# Patient Record
Sex: Female | Born: 1995 | Race: Black or African American | Hispanic: No | Smoking: Former smoker
Health system: Southern US, Community
[De-identification: ages and names within clinical notes are randomized; demographics above are authoritative.]

## PROBLEM LIST (undated history)

## (undated) DIAGNOSIS — K641 Second degree hemorrhoids: Secondary | ICD-10-CM

## (undated) HISTORY — DX: Second degree hemorrhoids: K64.1

## (undated) HISTORY — PX: OTHER SURGICAL HISTORY: SHX169

---

## 2017-11-05 ENCOUNTER — Other Ambulatory Visit: Payer: Self-pay

## 2017-11-05 ENCOUNTER — Emergency Department (HOSPITAL_COMMUNITY)
Admission: EM | Admit: 2017-11-05 | Discharge: 2017-11-05 | Disposition: A | Discharge status: Home / Self Care | Payer: Self-pay | Attending: Emergency Medicine | Admitting: Emergency Medicine

## 2017-11-05 ENCOUNTER — Emergency Department (HOSPITAL_COMMUNITY): Payer: Self-pay

## 2017-11-05 ENCOUNTER — Encounter (HOSPITAL_COMMUNITY): Payer: Self-pay | Admitting: Emergency Medicine

## 2017-11-05 DIAGNOSIS — R0789 Other chest pain: Secondary | ICD-10-CM | POA: Insufficient documentation

## 2017-11-05 DIAGNOSIS — F17210 Nicotine dependence, cigarettes, uncomplicated: Secondary | ICD-10-CM | POA: Insufficient documentation

## 2017-11-05 LAB — BASIC METABOLIC PANEL
ANION GAP: 7 (ref 5–15)
BUN: 8 mg/dL (ref 6–20)
CHLORIDE: 105 mmol/L (ref 98–111)
CO2: 26 mmol/L (ref 22–32)
Calcium: 9.5 mg/dL (ref 8.9–10.3)
Creatinine, Ser: 1.02 mg/dL — ABNORMAL HIGH (ref 0.44–1.00)
GFR calc Af Amer: >60 mL/min (ref >60)
GLUCOSE: 97 mg/dL (ref 70–99)
Potassium: 4.3 mmol/L (ref 3.5–5.1)
Sodium: 138 mmol/L (ref 135–145)

## 2017-11-05 LAB — I-STAT TROPONIN, ED: Troponin i, poc: 0.02 ng/mL (ref 0.00–0.08)

## 2017-11-05 LAB — CBC
HEMATOCRIT: 42.4 % (ref 36.0–46.0)
HEMOGLOBIN: 12.9 g/dL (ref 12.0–15.0)
MCH: 26.1 pg (ref 26.0–34.0)
MCHC: 30.4 g/dL (ref 30.0–36.0)
MCV: 85.8 fL (ref 78.0–100.0)
Platelets: 204 10*3/uL (ref 150–400)
RBC: 4.94 MIL/uL (ref 3.87–5.11)
RDW: 12.7 % (ref 11.5–15.5)
WBC: 5.5 10*3/uL (ref 4.0–10.5)

## 2017-11-05 LAB — D-DIMER, QUANTITATIVE (NOT AT ARMC)

## 2017-11-05 LAB — I-STAT BETA HCG BLOOD, ED (MC, WL, AP ONLY): I-stat hCG, quantitative: <5 m[IU]/mL (ref <5)

## 2017-11-05 MED ORDER — NAPROXEN 500 MG PO TABS: 500.0000 mg | ORAL_TABLET | Freq: Two times a day (BID) | ORAL | 0 refills | Status: DC

## 2017-11-05 NOTE — ED Triage Notes (Signed)
Patient to ED c/o L sided CP x 1 month, worse over the last couple of days, intermittent and sharp in nature. Denies worse with movement, non-tender, no SOB or lightheadedness. Pt endorses increase in stress recently, LMP 3 months ago.

## 2017-11-05 NOTE — ED Provider Notes (Signed)
[facility]   CSN: [id number] Arrival date & time: 11/05/17  0830     History   Chief Complaint Chief Complaint  Patient presents with  . Chest Pain    HPI Elaine PhenixDontasha Rodriguez is a 22 y.o. female.  Patient is a 22 year old female with no significant past medical history who presents with chest pain.  She describes a 6416-month history of some intermittent pain in her left upper chest.  She states that usually lasts for about an hour.  She says it slightly worse with breathing.  She denies any shortness of breath.  Is not really worse with movement.  She has no nausea or vomiting.  No cough or chest congestion.  No prior history of heart problems.  She has not been evaluated for this pain in the past.  She is a non-smoker.  She denies any leg pain or swelling.  She denies any recent immobilization other than she did just recently traveled from [place].  No history of DVTs.  She is not on oral contraceptives.     History reviewed. No pertinent past medical history.  There are no active problems to display for this patient.   History reviewed. No pertinent surgical history.   OB History   None      Home Medications    Prior to Admission medications   Medication Sig Start Date End Date Taking? Authorizing Provider  naproxen (NAPROSYN) 500 MG tablet Take 1 tablet (500 mg total) by mouth 2 (two) times daily. 11/05/17   Rolan BuccoBelfi, Maaliyah Adolph, MD    Family History No family history on file.  Social History Social History   Tobacco Use  . Smoking status: Current Some Day Smoker  . Smokeless tobacco: Never Used  Substance Use Topics  . Alcohol use: Yes    Comment: occ  . Drug use: Never     Allergies   Patient has no known allergies.   Review of Systems Review of Systems  Constitutional: Negative for chills, diaphoresis, fatigue and fever.  HENT: Negative for congestion, rhinorrhea and sneezing.   Eyes: Negative.     Respiratory: Negative for cough, chest tightness and shortness of breath.   Cardiovascular: Positive for chest pain. Negative for leg swelling.  Gastrointestinal: Negative for abdominal pain, blood in stool, diarrhea, nausea and vomiting.  Genitourinary: Negative for difficulty urinating, flank pain, frequency and hematuria.  Musculoskeletal: Negative for arthralgias and back pain.  Skin: Negative for rash.  Neurological: Negative for dizziness, speech difficulty, weakness, numbness and headaches.     Physical Exam Updated Vital Signs BP 107/70   Pulse 67   Temp 98.7 F (37.1 C) (Oral)   Resp 17   LMP 08/07/2017 (Approximate) Comment: reports hasn't had one in 3 months, thinks d/t stress  SpO2 99%   Physical Exam  Constitutional: She is oriented to person, place, and time. She appears well-developed and well-nourished.  HENT:  Head: Normocephalic and atraumatic.  Eyes: Pupils are equal, round, and reactive to light.  Neck: Normal range of motion. Neck supple.  Cardiovascular: Normal rate, regular rhythm and normal heart sounds.  Patient has some tenderness to her left upper chest wall, no crepitus or deformity, no overlying rash  Pulmonary/Chest: Effort normal and breath sounds normal. No respiratory distress. She has no wheezes. She has no rales. She exhibits no tenderness.  Abdominal: Soft. Bowel sounds are normal. There is no tenderness. There is no rebound and no guarding.  Musculoskeletal: Normal  range of motion. She exhibits no edema.  No edema or calf tenderness  Lymphadenopathy:    She has no cervical adenopathy.  Neurological: She is alert and oriented to person, place, and time.  Skin: Skin is warm and dry. No rash noted.  Psychiatric: She has a normal mood and affect.     ED Treatments / Results  Labs (all labs ordered are listed, but only abnormal results are displayed) Labs Reviewed  BASIC METABOLIC PANEL - Abnormal; Notable for the following components:       Result Value   Creatinine, Ser 1.02 (*)    All other components within normal limits  CBC  D-DIMER, QUANTITATIVE (NOT AT [facility])  I-STAT TROPONIN, ED  I-STAT BETA HCG BLOOD, ED ([facility], [facility], [facility] ONLY)    EKG EKG Interpretation  Date/Time:  Tuesday November 05 2017 08:40:16 EDT Ventricular Rate:  83 PR Interval:  138 QRS Duration: 80 QT Interval:  356 QTC Calculation: 418 R Axis:   70 Text Interpretation:  Normal sinus rhythm with sinus arrhythmia no acute ST/T changes No old tracing to compare Confirmed by Pricilla LovelessGoldston, Scott [phone]([id number]) on 11/05/2017 8:58:45 AM   Radiology Dg Chest 2 View  Result Date: 11/05/2017 CLINICAL DATA:  Midline to left-sided chest pain radiating into the left shoulder with numbness in the left arm and hand. Current smoker. EXAM: CHEST - 2 VIEW COMPARISON:  None in PACs FINDINGS: The lungs are well-expanded and clear. The heart and mediastinal structures are normal. There is no pleural effusion or pneumothorax. The bony thorax exhibits no acute abnormality. IMPRESSION: There is no active cardiopulmonary disease. Electronically Signed   By: David  Swazil[place]Jordan M.D.   On: 11/05/2017 09:17    Procedures Procedures (including critical care time)  Medications Ordered in ED Medications - No data to display   Initial Impression / Assessment and Plan / ED Course  I have reviewed the triage vital signs and the nursing notes.  Pertinent labs & imaging results that were available during my care of the patient were reviewed by me and considered in my medical decision making (see chart for details).     Patient presents with left upper chest pain which is atypical in nature.  It is reproducible on palpation.  Her d-dimer is negative without other suggestions of PE.  She has no ischemic changes on EKG.  Her troponin is negative.  I feel this is likely musculoskeletal in nature.  Her chest x-ray is clear without evidence of pneumonia or pneumothorax.  She was given a prescription for  Naprosyn to use for pain.Marland Kitchen.  She was encouraged to follow-up with her PCP in [place] when she returns there next week.  Return precautions were given.  Final Clinical Impressions(s) / ED Diagnoses   Final diagnoses:  Atypical chest pain    ED Discharge Orders         Ordered    naproxen (NAPROSYN) 500 MG tablet  2 times daily     11/05/17 1333           Rolan BuccoBelfi, Raider Valbuena, MD 11/05/17 1346

## 2018-05-29 ENCOUNTER — Emergency Department (HOSPITAL_COMMUNITY)
Admission: EM | Admit: 2018-05-29 | Discharge: 2018-05-29 | Disposition: A | Discharge status: Home / Self Care | Payer: Medicaid Other | Attending: Emergency Medicine | Admitting: Emergency Medicine

## 2018-05-29 ENCOUNTER — Encounter (HOSPITAL_COMMUNITY): Payer: Self-pay

## 2018-05-29 ENCOUNTER — Other Ambulatory Visit: Payer: Self-pay

## 2018-05-29 DIAGNOSIS — F17200 Nicotine dependence, unspecified, uncomplicated: Secondary | ICD-10-CM | POA: Insufficient documentation

## 2018-05-29 DIAGNOSIS — H109 Unspecified conjunctivitis: Secondary | ICD-10-CM

## 2018-05-29 DIAGNOSIS — H1032 Unspecified acute conjunctivitis, left eye: Secondary | ICD-10-CM | POA: Insufficient documentation

## 2018-05-29 MED ORDER — BACITRACIN-POLYMYXIN B 500-10000 UNIT/GM OP OINT: 1.0000 "application " | TOPICAL_OINTMENT | Freq: Two times a day (BID) | OPHTHALMIC | 0 refills | Status: AC

## 2018-05-29 NOTE — ED Triage Notes (Signed)
Pt states she works at [employer] and thinks she contracted pink eye from a resident. L eye is swollen, red and itchy.

## 2018-05-29 NOTE — Discharge Instructions (Addendum)
You were evaluated in the Emergency Department and after careful evaluation, we did not find any emergent condition requiring admission or further testing in the hospital.  Your symptoms today seem to be due to pink eye.  Please use the ointment as directed.  Please return to the Emergency Department if you experience any worsening of your condition.  We encourage you to follow up with a primary care provider.  Thank you for allowing [place] to be a part of your care.

## 2018-05-29 NOTE — ED Provider Notes (Signed)
[facility] Emergency Department Provider Note MRN:  [id number]  Arrival date & time: 05/29/18     Chief Complaint   Conjunctivitis   History of Present Illness   Elaine Rodriguez is a 23 y.o. year-old female with no pertinent past medical history presenting to the ED with chief complaint of conjunctivitis.  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  Patient started having left eye irritation and redness 2 days ago, persistent, constant.  Denies trauma to the eye.  Denies fever, no vision loss, no other symptoms.  Review of Systems  A problem-focused ROS was performed. Positive for eye irritation.  Patient denies vision loss.  Patient's Health History   History reviewed. No pertinent past medical history.  History reviewed. No pertinent surgical history.  History reviewed. No pertinent family history.  Social History   Socioeconomic History  . Marital status: Single    Spouse name: Not on file  . Number of children: Not on file  . Years of education: Not on file  . Highest education level: Not on file  Occupational History  . Not on file  Social Needs  . Financial resource strain: Not on file  . Food insecurity:    Worry: Not on file    Inability: Not on file  . Transportation needs:    Medical: Not on file    Non-medical: Not on file  Tobacco Use  . Smoking status: Current Some Day Smoker  . Smokeless tobacco: Never Used  Substance and Sexual Activity  . Alcohol use: Yes    Comment: occ  . Drug use: Never  . Sexual activity: Not on file  Lifestyle  . Physical activity:    Days per week: Not on file    Minutes per session: Not on file  . Stress: Not on file  Relationships  . Social connections:    Talks on phone: Not on file    Gets together: Not on file    Attends religious service: Not on file    Active member of club or organization: Not on file    Attends meetings of clubs or  organizations: Not on file    Relationship status: Not on file  . Intimate partner violence:    Fear of current or ex partner: Not on file    Emotionally abused: Not on file    Physically abused: Not on file    Forced sexual activity: Not on file  Other Topics Concern  . Not on file  Social History Narrative  . Not on file     Physical Exam  Vital Signs and Nursing Notes reviewed Vitals:   05/29/18 1104  BP: (!) 141/81  Pulse: 61  Resp: 16  Temp: 98.4 F (36.9 C)  SpO2: 98%    CONSTITUTIONAL: Well-appearing, NAD NEURO:  Alert and oriented x 3, no focal deficits EYES:  eyes equal and reactive, normal extraocular movements, left conjunctival erythema, equal visual acuity ENT/NECK:  no LAD, no JVD CARDIO: Regular rate, well-perfused, normal S1 and S2 PULM:  CTAB no wheezing or rhonchi GI/GU:  normal bowel sounds, non-distended, non-tender MSK/SPINE:  No gross deformities, no edema SKIN:  no rash, atraumatic PSYCH:  Appropriate speech and behavior  Diagnostic and Interventional Summary    Labs Reviewed - No data to display  No orders to display    Medications - No data to display   Procedures Critical Care  ED Course and  Medical Decision Making  I have reviewed the triage vital signs and the nursing notes.  Pertinent labs & imaging results that were available during my care of the patient were reviewed by me and considered in my medical decision making (see below for details).  Consistent with uncomplicated conjunctivitis, viral versus bacterial.  Patient does not wear contacts, no trauma, no vision loss.  Will provide polymyxin ointment.  After the discussed management above, the patient was determined to be safe for discharge.  The patient was in agreement with this plan and all questions regarding their care were answered.  ED return precautions were discussed and the patient will return to the ED with any significant worsening of condition.  Elmer Sow. Pilar Plate, MD  [facility] Emergency Medicine [facility] [email or web address]  Final Clinical Impressions(s) / ED Diagnoses     ICD-10-CM   1. Conjunctivitis of left eye, unspecified conjunctivitis type H10.9     ED Discharge Orders         Ordered    bacitracin-polymyxin b (POLYSPORIN) ophthalmic ointment  Every 12 hours     05/29/18 1222             Sabas Sous, MD 05/29/18 1226

## 2018-10-27 ENCOUNTER — Emergency Department (HOSPITAL_COMMUNITY): Payer: Self-pay

## 2018-10-27 ENCOUNTER — Encounter (HOSPITAL_COMMUNITY): Payer: Self-pay | Admitting: Emergency Medicine

## 2018-10-27 ENCOUNTER — Emergency Department (HOSPITAL_COMMUNITY)
Admission: EM | Admit: 2018-10-27 | Discharge: 2018-10-27 | Disposition: A | Discharge status: Home / Self Care | Payer: Self-pay | Attending: Pediatric Emergency Medicine | Admitting: Pediatric Emergency Medicine

## 2018-10-27 ENCOUNTER — Other Ambulatory Visit: Payer: Self-pay

## 2018-10-27 DIAGNOSIS — W25XXXA Contact with sharp glass, initial encounter: Secondary | ICD-10-CM | POA: Insufficient documentation

## 2018-10-27 DIAGNOSIS — Y999 Unspecified external cause status: Secondary | ICD-10-CM | POA: Insufficient documentation

## 2018-10-27 DIAGNOSIS — F17200 Nicotine dependence, unspecified, uncomplicated: Secondary | ICD-10-CM | POA: Insufficient documentation

## 2018-10-27 DIAGNOSIS — S71112A Laceration without foreign body, left thigh, initial encounter: Secondary | ICD-10-CM | POA: Insufficient documentation

## 2018-10-27 DIAGNOSIS — Y939 Activity, unspecified: Secondary | ICD-10-CM | POA: Insufficient documentation

## 2018-10-27 DIAGNOSIS — Y929 Unspecified place or not applicable: Secondary | ICD-10-CM | POA: Insufficient documentation

## 2018-10-27 DIAGNOSIS — S81812A Laceration without foreign body, left lower leg, initial encounter: Secondary | ICD-10-CM

## 2018-10-27 DIAGNOSIS — Z23 Encounter for immunization: Secondary | ICD-10-CM | POA: Insufficient documentation

## 2018-10-27 LAB — POC URINE PREG, ED: Preg Test, Ur: NEGATIVE

## 2018-10-27 MED ORDER — LIDOCAINE-EPINEPHRINE (PF) 1 %-1:200000 IJ SOLN
10.0000 mL | Freq: Once | INTRAMUSCULAR | Status: AC
  Administered 2018-10-27: 10 mL via INTRADERMAL
  Filled 2018-10-27: qty 30

## 2018-10-27 MED ORDER — TETANUS-DIPHTH-ACELL PERTUSSIS 5-2.5-18.5 LF-MCG/0.5 IM SUSP
0.5000 mL | Freq: Once | INTRAMUSCULAR | Status: AC
  Administered 2018-10-27: 0.5 mL via INTRAMUSCULAR
  Filled 2018-10-27: qty 0.5

## 2018-10-27 MED ORDER — BACITRACIN ZINC 500 UNIT/GM EX OINT
TOPICAL_OINTMENT | Freq: Two times a day (BID) | CUTANEOUS | Status: DC
  Administered 2018-10-27: 05:00:00 via TOPICAL
  Filled 2018-10-27: qty 0.9

## 2018-10-27 NOTE — ED Notes (Signed)
Wound cleaned, bacitracin applied and wrapped

## 2018-10-27 NOTE — ED Notes (Signed)
ED Provider at bedside. 

## 2018-10-27 NOTE — ED Triage Notes (Signed)
Pt having like a 3 inches long laceration on her left thigh. Wound cleaned and blood controled.

## 2018-10-27 NOTE — Discharge Instructions (Addendum)
Please return for suture removal in 10 to 14 days.  Please return for any streaking redness coming from the wound pus drainage fever or unable to walk.

## 2018-10-27 NOTE — ED Notes (Signed)
ED Provider at bedside for lac repair 

## 2018-10-27 NOTE — ED Provider Notes (Signed)
[facility] Provider Note   CSN: [id number] Arrival date & time: 10/27/18  0024    History   Chief Complaint Chief Complaint  Patient presents with  . Laceration    HPI Elaine Rodriguez is a 23 y.o. female.     HPI   23 year old female otherwise healthy was cleaning a bearded dragon cage 1 glass shattered and cut her left thigh.  Pressure applied at home and bleeding stopped now presents.  No fevers cough or other sick symptoms.  History reviewed. No pertinent past medical history.  There are no active problems to display for this patient.   History reviewed. No pertinent surgical history.   OB History   No obstetric history on file.      Home Medications    Prior to Admission medications   Medication Sig Start Date End Date Taking? Authorizing Provider  naproxen (NAPROSYN) 500 MG tablet Take 1 tablet (500 mg total) by mouth 2 (two) times daily. 11/05/17   Rolan BuccoBelfi, Melanie, MD    Family History No family history on file.  Social History Social History   Tobacco Use  . Smoking status: Current Some Day Smoker  . Smokeless tobacco: Never Used  Substance Use Topics  . Alcohol use: Yes    Comment: occ  . Drug use: Never     Allergies   Patient has no known allergies.   Review of Systems Review of Systems  Constitutional: Positive for activity change.  Skin: Positive for wound.  All other systems reviewed and are negative.    Physical Exam Updated Vital Signs BP 116/77 (BP Location: Right Arm)   Pulse 72   Temp 98.2 F (36.8 C) (Oral)   Resp 16   Ht 5\' 11"  (1.803 m)   Wt 92.1 kg   SpO2 99%   BMI 28.31 kg/m   Physical Exam Vitals signs and nursing note reviewed.  Constitutional:      General: She is not in acute distress.    Appearance: She is well-developed.  HENT:     Head: Normocephalic and atraumatic.  Eyes:     Conjunctiva/sclera: Conjunctivae normal.  Neck:     Musculoskeletal: Neck  supple.  Cardiovascular:     Rate and Rhythm: Normal rate and regular rhythm.     Heart sounds: No murmur.  Pulmonary:     Effort: Pulmonary effort is normal. No respiratory distress.     Breath sounds: Normal breath sounds.  Abdominal:     Palpations: Abdomen is soft.     Tenderness: There is no abdominal tenderness.  Musculoskeletal:        General: Signs of injury (7-1/2 cm horseshoe laceration to left lateral thigh deep and gaping oozing) present.  Skin:    General: Skin is warm and dry.     Capillary Refill: Capillary refill takes less than 2 seconds.  Neurological:     General: No focal deficit present.     Mental Status: She is alert.      ED Treatments / Results  Labs (all labs ordered are listed, but only abnormal results are displayed) Labs Reviewed  POC URINE PREG, ED    EKG None  Radiology Dg Femur Min 2 Views Left  Result Date: 10/27/2018 CLINICAL DATA:  23 year old female with laceration of the cyst left thigh. EXAM: LEFT FEMUR 2 VIEWS COMPARISON:  None. FINDINGS: There is no acute fracture or dislocation. The bones are well mineralized. No arthritic changes. Laceration of the lateral  aspect of the left thigh. No radiopaque foreign object. IMPRESSION: 1. No acute/traumatic osseous pathology. 2. No radiopaque foreign body. Electronically Signed   By: Anner Crete M.D.   On: 10/27/2018 02:15    Procedures .Marland KitchenLaceration Repair  Date/Time: 10/27/2018 4:36 AM Performed by: Brent Bulla, MD Authorized by: Brent Bulla, MD   Consent:    Consent obtained:  Verbal   Consent given by:  Patient   Risks discussed:  Infection, pain and poor wound healing   Alternatives discussed:  No treatment Anesthesia (see MAR for exact dosages):    Anesthesia method:  Local infiltration   Local anesthetic:  Lidocaine 1% WITH epi Laceration details:    Location:  Leg   Leg location:  L upper leg   Length (cm):  7.5   Depth (mm):  7 Repair type:    Repair type:   Intermediate Pre-procedure details:    Preparation:  Patient was prepped and draped in usual sterile fashion Exploration:    Hemostasis achieved with:  Epinephrine   Wound exploration: wound explored through full range of motion and entire depth of wound probed and visualized     Contaminated: no   Treatment:    Area cleansed with:  Saline and Shur-Clens   Amount of cleaning:  Standard Subcutaneous repair:    Suture size:  4-0   Suture material:  Vicryl   Suture technique:  Horizontal mattress   Number of sutures:  3 Skin repair:    Repair method:  Sutures   Suture size:  4-0   Suture material:  Prolene   Suture technique:  Simple interrupted   Number of sutures:  19 Approximation:    Approximation:  Close Post-procedure details:    Dressing:  Antibiotic ointment   Patient tolerance of procedure:  Tolerated well, no immediate complications   (including critical care time)  Medications Ordered in ED Medications  bacitracin ointment (has no administration in time range)  Tdap (BOOSTRIX) injection 0.5 mL (has no administration in time range)  lidocaine-EPINEPHrine (XYLOCAINE-EPINEPHrine) 1 %-1:200000 (PF) injection 10 mL (10 mLs Intradermal Given 10/27/18 0341)     Initial Impression / Assessment and Plan / ED Course  I have reviewed the triage vital signs and the nursing notes.  Pertinent labs & imaging results that were available during my care of the patient were reviewed by me and considered in my medical decision making (see chart for details).        23 year old with laceration to left thigh. No LOC, no vomiting and no other injury.  Normal neurovascular exam making deep tissue injury unlikely.  X-ray obtained without visualized foreign body I reviewed and agree.  Wound cleaned and closed. Tetanus updated. Discussed that sutures should be removed in 10 to 14 days. Discussed signs infection that warrant reevaluation. Discussed scar minimalization. Will have follow with  PCP as needed.   Final Clinical Impressions(s) / ED Diagnoses   Final diagnoses:  Laceration of left lower extremity, initial encounter    ED Discharge Orders    None       Cheney Ewart, Lillia Carmel, MD 10/27/18 [phone]

## 2019-04-15 ENCOUNTER — Encounter: Payer: Self-pay | Admitting: Internal Medicine

## 2019-04-15 ENCOUNTER — Other Ambulatory Visit: Payer: Self-pay

## 2019-04-15 ENCOUNTER — Ambulatory Visit: Payer: Medicaid Other | Admitting: Internal Medicine

## 2019-04-15 VITALS — BP 122/78 | HR 80 | Resp 12 | Ht 67.5 in | Wt 215.0 lb

## 2019-04-15 DIAGNOSIS — M940 Chondrocostal junction syndrome [Tietze]: Secondary | ICD-10-CM

## 2019-04-15 DIAGNOSIS — Z716 Tobacco abuse counseling: Secondary | ICD-10-CM

## 2019-04-15 NOTE — Progress Notes (Signed)
Subjective:    Patient ID: Elaine Rodriguez, female   DOB: March 10, 1996, 24 y.o.   MRN: [id number]   HPI   Here to establish.  Intermittent anterior left chest discomfort for about 6 months.  Lays her open hand on an area in upper medial anterior chest.  Describes intermittent soreness.  Notes if she stretches or contracts the muscles with twisting her torso she has more discomfort.   She also can note the same discomfort about 1/2 hour after eating.  She can be sitting when this occurs.   Occurs maybe 3 times weekly and can last hours.  No cough or dyspnea.   No radiation of the pain. Has not tried any medication for the discomfort.    No definite history of over use discomfort or injury she can recall.  She used to [employer] in a [employer], but that was over 1 year ago.     No outpatient medications have been marked as taking for the 04/15/19 encounter (Office Visit) with Julieanne Manson, MD.   No Known Allergies   History reviewed. No pertinent past medical history.  History reviewed. No pertinent surgical history.   Family History  Problem Relation Age of Onset  . Hypertension Mother   . Diabetes Mother   . Cancer Mother        Stage 4 breast cancer.  . Hypertension Father     Social History   Socioeconomic History  . Marital status: Media planner    Spouse name: Not on file  . Number of children: 0  . Years of education: Not on file  . Highest education level: High school graduate  Occupational History  . 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 Smoking status: Current Every Day Smoker    Packs/day: 0.25    Years: 7.00    Pack years: 1.75    Types: Cigarettes  . Smokeless tobacco: Never Used  Substance and Sexual Activity  . Alcohol use: Yes    Comment: occ  . Drug use: Never  . Sexual activity: Yes    Birth control/protection: None    Comment: Lesbian  Other Topics Concern  . Not on file  Social  History Narrative   Lives with her female partner.     They are engaged.   [employer], a dog, 2 birds, and 2 geckos.   Social Determinants of Health   Financial Resource Strain: Low Risk   . Difficulty of Paying Living Expenses: Not very hard  Food Insecurity: No Food Insecurity  . Worried About Programme researcher, broadcasting/film/video in the Last Year: Never true  . Ran Out of Food in the Last Year: Never true  Transportation Needs: No Transportation Needs  . Lack of Transportation (Medical): No  . Lack of Transportation (Non-Medical): No  Physical Activity:   . Days of Exercise per Week:   . Minutes of Exercise per Session:   Stress:   . Feeling of Stress :   Social Connections:   . Frequency of Communication with Friends and Family:   . Frequency of Social Gatherings with Friends and Family:   . Attends Religious Services:   . Active Member of Clubs or Organizations:   . Attends Banker Meetings:   Marland Kitchen Marital Status:   Intimate Partner Violence: Not At Risk  . Fear of Current or Ex-Partner: No  . Emotionally Abused: No  . Physically Abused: No  .  Sexually Abused: No       Review of Systems    Objective:   BP 122/78 (BP Location: Left Arm, Patient Position: Sitting, Cuff Size: Normal)   Pulse 80   Resp 12   Ht 5' 7.5" (1.715 m)   Wt 215 lb (97.5 kg)   LMP 02/09/2019 (Approximate)   BMI 33.18 kg/m   Physical Exam  NAD HEENT:  PERRL, EOMI Neck:  Supple, no adenopathy, no thyromegaly Chest:  CTA.  Point tenderness at about the 2nd rib joint with left sternum.  Reproduces pain CV:  RRR with normal S1 and S2, No S3, S4 or murmur.  Radial and DP pulses normal and equal Abd:  S, NT, No HSM or mass, + BS LE:  No edema.   Assessment & Plan   1.  Left 2nd rib costochondritis:  Aleve 1-2 tabs by mouth twice daily with meal for 14 days.  Thereafter, only as needed. Gentle stretching exercises given to perform twice daily.  2.  Tobacco use/counseling:  Patient  instructions.  Recommended using nicotine gum or lozenge.

## 2019-04-15 NOTE — Patient Instructions (Signed)
Aleve 1-2 tabs by mouth with food twice daily for 14 days. Gentle stretches as discussed twice daily  Tobacco Cessation:   [id number] or [phone], the former for support and possibly free nicotine patches/gum and support; the latter for [facility] Smoking cessation class.  Get rid of all smoking supplies:  Cigarettes, lighters, ashtrays--no stashes just in case at home if you are serious.  For Nicotine gum (or lozenge):  When you feel need for smoking:  Place nicotine gum in mouth and chew until juicy, then stick between gum and cheek.  You will absorb the nicotine from your mouth.   Do not aggressively chew gum. Spit gum out in garbage once you feel you are done with it.

## 2019-07-11 DIAGNOSIS — M940 Chondrocostal junction syndrome [Tietze]: Secondary | ICD-10-CM | POA: Insufficient documentation

## 2019-07-11 DIAGNOSIS — Z716 Tobacco abuse counseling: Secondary | ICD-10-CM | POA: Insufficient documentation

## 2019-07-16 ENCOUNTER — Other Ambulatory Visit: Payer: Medicaid Other

## 2019-07-20 ENCOUNTER — Encounter: Payer: Medicaid Other | Admitting: Internal Medicine

## 2019-08-21 ENCOUNTER — Encounter: Payer: Medicaid Other | Admitting: Internal Medicine

## 2019-11-06 ENCOUNTER — Other Ambulatory Visit: Payer: Self-pay

## 2019-11-06 ENCOUNTER — Other Ambulatory Visit (HOSPITAL_COMMUNITY)
Admission: RE | Admit: 2019-11-06 | Discharge: 2019-11-06 | Disposition: A | Discharge status: Home / Self Care | Payer: Medicaid Other | Source: Ambulatory Visit | Attending: Certified Nurse Midwife | Admitting: Certified Nurse Midwife

## 2019-11-06 ENCOUNTER — Ambulatory Visit (INDEPENDENT_AMBULATORY_CARE_PROVIDER_SITE_OTHER): Payer: Medicaid Other | Admitting: Certified Nurse Midwife

## 2019-11-06 ENCOUNTER — Encounter: Payer: Self-pay | Admitting: Certified Nurse Midwife

## 2019-11-06 VITALS — BP 124/75 | HR 75 | Temp 97.9°F | Ht 66.0 in | Wt 212.0 lb

## 2019-11-06 DIAGNOSIS — Z01419 Encounter for gynecological examination (general) (routine) without abnormal findings: Secondary | ICD-10-CM | POA: Insufficient documentation

## 2019-11-06 DIAGNOSIS — F419 Anxiety disorder, unspecified: Secondary | ICD-10-CM

## 2019-11-06 DIAGNOSIS — N939 Abnormal uterine and vaginal bleeding, unspecified: Secondary | ICD-10-CM

## 2019-11-06 DIAGNOSIS — Z113 Encounter for screening for infections with a predominantly sexual mode of transmission: Secondary | ICD-10-CM | POA: Diagnosis not present

## 2019-11-06 DIAGNOSIS — Z308 Encounter for other contraceptive management: Secondary | ICD-10-CM

## 2019-11-06 NOTE — Patient Instructions (Signed)
Abnormal Uterine Bleeding °Abnormal uterine bleeding means bleeding more than usual from your uterus. It can include: °· Bleeding between periods. °· Bleeding after sex. °· Bleeding that is heavier than normal. °· Periods that last longer than usual. °· Bleeding after you have stopped having your period (menopause). °There are many problems that may cause this. You should see a doctor for any kind of bleeding that is not normal. Treatment depends on the cause of the bleeding. °Follow these instructions at home: °· Watch your condition for any changes. °· Do not use tampons, douche, or have sex, if your doctor tells you not to. °· Change your pads often. °· Get regular well-woman exams. Make sure they include a pelvic exam and cervical cancer screening. °· Keep all follow-up visits as told by your doctor. This is important. °Contact a doctor if: °· The bleeding lasts more than one week. °· You feel dizzy at times. °· You feel like you are going to throw up (nauseous). °· You throw up. °Get help right away if: °· You pass out. °· You have to change pads every hour. °· You have belly (abdominal) pain. °· You have a fever. °· You get sweaty. °· You get weak. °· You passing large blood clots from your vagina. °Summary °· Abnormal uterine bleeding means bleeding more than usual from your uterus. °· There are many problems that may cause this. You should see a doctor for any kind of bleeding that is not normal. °· Treatment depends on the cause of the bleeding. °This information is not intended to replace advice given to you by your health care provider. Make sure you discuss any questions you have with your health care provider. °Document Revised: 02/28/2016 Document Reviewed: 02/28/2016 °Elsevier Patient Education © 2020 [employer] ° °

## 2019-11-06 NOTE — Progress Notes (Signed)
GYNECOLOGY ANNUAL PREVENTATIVE CARE ENCOUNTER NOTE  History:     Mayu Ronk is a 24 y.o. No obstetric history on file. female here for a routine annual gynecologic exam.  Current complaints: AUB. Patient reports that she gets a cycle once every 5 months.  Denies abnormal discharge, pelvic pain, problems with intercourse or other gynecologic concerns. Request STD screening today.    Gynecologic History Patient's last menstrual period was 11/01/2019 (approximate). Contraception: none  Obstetric History OB History  No obstetric history on file.    History reviewed. No pertinent past medical history.  History reviewed. No pertinent surgical history.  No current outpatient medications on file prior to visit.   No current facility-administered medications on file prior to visit.    No Known Allergies  Social History:  reports that she has quit smoking. Her smoking use included cigarettes. She has a 1.75 pack-year smoking history. She has never used smokeless tobacco. She reports current alcohol use. She reports that she does not use drugs.  Family History  Problem Relation Age of Onset  . Hypertension Mother   . Diabetes Mother   . Cancer Mother        Stage 4 breast cancer.  . Hypertension Father     The following portions of the patient's history were reviewed and updated as appropriate: allergies, current medications, past family history, past medical history, past social history, past surgical history and problem list.  Review of Systems Pertinent items noted in HPI and remainder of comprehensive ROS otherwise negative.  Physical Exam:  BP 124/75 (BP Location: Left Arm, Patient Position: Sitting, Cuff Size: Normal)   Pulse 75   Temp 97.9 F (36.6 C) (Oral)   Ht 5\' 6"  (1.676 m)   Wt 212 lb (96.2 kg)   LMP 11/01/2019 (Approximate)   BMI 34.22 kg/m  CONSTITUTIONAL: Well-developed, well-nourished female in no acute distress.  HENT:  Normocephalic,  atraumatic, External right and left ear normal. Oropharynx is clear and moist EYES: Conjunctivae and EOM are normal. Pupils are equal, round, and reactive to light.  NECK: Normal range of motion, supple, no masses.  Normal thyroid.  SKIN: Skin is warm and dry. No rash noted. Not diaphoretic. No erythema. No pallor. MUSCULOSKELETAL: Normal range of motion. No tenderness.  No cyanosis, clubbing, or edema.  2+ distal pulses. NEUROLOGIC: Alert and oriented to person, place, and time. Normal reflexes, muscle tone coordination.  PSYCHIATRIC: Normal mood and affect. Normal behavior. Normal judgment and thought content. CARDIOVASCULAR: Normal heart rate noted, regular rhythm RESPIRATORY: Clear to auscultation bilaterally. Effort and breath sounds normal, no problems with respiration noted. BREASTS: Symmetric in size. No masses, tenderness, skin changes, nipple drainage, or lymphadenopathy bilaterally. Bilateral nipple piercing. Performed in the presence of a chaperone. ABDOMEN: Soft, no distention noted.  No tenderness, rebound or guarding.  PELVIC: Normal appearing external genitalia and urethral meatus; normal appearing vaginal mucosa and cervix.  No abnormal discharge noted.  Pap smear obtained.  Normal uterine size, no other palpable masses, no uterine or adnexal tenderness.  Performed in the presence of a chaperone.   Assessment and Plan:    1. Encounter for routine gynecological examination with Papanicolaou smear of cervix - Normal well woman examination  - Patient has a maternal fam hx of breast cancer, educated and discussed genetic screening and recommended patient to get mammograms starting at 26yo, patient verbalizes understanding  - Cytology - PAP( [facility])  2. Anxiety - Based on GAD-7 screening completed in office  today patient scored 13, patient denies hx of anxiety of depression. States she just recently started feeling anxious this year.  - Discussed with patient counseling as first  line, discussed with patient can meet with Sue Lush to get plan in place  - patient agrees and want to talk to someone   3. Screening examination for STD (sexually transmitted disease) - Patient request STD screening today  - HIV Antibody (routine testing w rflx) - [facility]  4. Abnormal uterine bleeding (AUB) - Patient currently not on birth control, reports AUB for the past 2-3 years, reports getting menstrual cycles every 5 months  - Patient denies hirsutism or fam hx of PCOS. Reports weight change of 50+ lbs over the past year  - Denies every having thyroid evaluated  - Patient unable to obtain labs today d/t insurance, starts new job on 9/1 and will have insurance to be able to obtain labs. Follow up lab appointment scheduled for 9/8  - TSH; Future - Prolactin; Future - Testosterone; Future - FSH; Future   Will follow up results of pap smear/STD screening and manage accordingly. Routine preventative health maintenance measures emphasized. Please refer to After Visit Summary for other counseling recommendations.      Sharyon Cable, CNM [facility] [employer], [facility]

## 2019-11-07 LAB — HIV ANTIBODY (ROUTINE TESTING W REFLEX): HIV Screen 4th Generation wRfx: NONREACTIVE

## 2019-11-07 LAB — RPR: RPR Ser Ql: NONREACTIVE

## 2019-11-09 LAB — CYTOLOGY - PAP
Chlamydia: NEGATIVE
Comment: NEGATIVE
Comment: NEGATIVE
Comment: NORMAL
Diagnosis: NEGATIVE
Neisseria Gonorrhea: NEGATIVE
Trichomonas: NEGATIVE

## 2019-11-25 ENCOUNTER — Ambulatory Visit (INDEPENDENT_AMBULATORY_CARE_PROVIDER_SITE_OTHER): Payer: Self-pay | Admitting: Licensed Clinical Social Worker

## 2019-11-25 ENCOUNTER — Other Ambulatory Visit: Payer: Self-pay

## 2019-11-25 ENCOUNTER — Other Ambulatory Visit: Payer: Medicaid Other

## 2019-11-25 DIAGNOSIS — F411 Generalized anxiety disorder: Secondary | ICD-10-CM

## 2019-11-25 DIAGNOSIS — N939 Abnormal uterine and vaginal bleeding, unspecified: Secondary | ICD-10-CM

## 2019-11-25 NOTE — BH Specialist Note (Signed)
Integrated Behavioral Health Initial Visit  MRN: [id number] Name: Elaine Rodriguez  Number of Integrated Behavioral Health Clinician visits:: 1 Session Start time: 9:50am Session End time: 10:48pm Total time: 58 mins in person at [facility]   Type of Service: Integrated Behavioral Health- Individual Interpretor:yes  Interpretor Name and Language: none    Warm Hand Off Completed.       SUBJECTIVE: Elaine Rodriguez is a 24 y.o. female accompanied by n/a Patient was referred by Lanice Shirts for elevated phq9 scores. Patient reports the following symptoms/concerns: depressed mood, decreased appetite, trouble staying asleep  Duration of problem: approx 2 years ; Severity of problem: mild  OBJECTIVE: Mood: good  and Affect: normal Risk of harm to self or others: No risk of harm to self or others.   LIFE CONTEXT: Family and Social: lives with partner/relocated to [place] from [place] 2 years ago  School/Work: Home [employer]od[employer]  Self-Care: None  Life Changes: Distant relatives moved to [place]   GOALS ADDRESSED: Patient will: 1. Reduce symptoms of: Anxiety disorder 2. Increase knowledge and/or ability of: diagnosis and implement coping skills to alleviate symptoms   3. Demonstrate ability to: self manage symptoms   INTERVENTIONS: Interventions utilized: supportive counseling   Standardized Assessments completed:  PHQ9 SCORE ONLY 11/25/2019 11/06/2019  PHQ-9 Total Score 13 6    ASSESSMENT: Patient currently experiencing anxiety disorder   Patient may benefit from integrated behavioral health   PLAN: 1. Follow up with behavioral health clinician on : 12/16/2019 2. Behavioral recommendation: write down needs and practice communicating needs. Engage in self care, and eat small meal throughout the day to replenish energy and boost mood  3. Referral(s): none  4. "From scale of 1-10, how likely are you to follow plan?":   Gwyndolyn Saxon, LCSW

## 2019-11-26 LAB — FOLLICLE STIMULATING HORMONE: FSH: 5.4 m[IU]/mL

## 2019-11-26 LAB — TSH: TSH: 0.652 u[IU]/mL (ref 0.450–4.500)

## 2019-11-26 LAB — PROLACTIN: Prolactin: 17.3 ng/mL (ref 4.8–23.3)

## 2019-11-26 LAB — TESTOSTERONE: Testosterone: 92 ng/dL — ABNORMAL HIGH (ref 13–71)

## 2019-12-16 ENCOUNTER — Encounter: Payer: Managed Care, Other (non HMO) | Admitting: Licensed Clinical Social Worker

## 2019-12-23 ENCOUNTER — Other Ambulatory Visit: Payer: Self-pay | Admitting: Certified Nurse Midwife

## 2019-12-23 DIAGNOSIS — K921 Melena: Secondary | ICD-10-CM

## 2019-12-23 NOTE — Progress Notes (Signed)
Returned patient's call- patient notified provider of having blood in stool most of the time she uses the restroom. Patient reports this has been occurring for the past 6 months.   Discussed with patient that we will send referral for gastroenterology. Encouraged to not strain and increase fiber in diet.   Sharyon Cable, CNM 12/23/19, 8:15 AM

## 2019-12-28 ENCOUNTER — Other Ambulatory Visit: Payer: Self-pay

## 2019-12-28 ENCOUNTER — Emergency Department (HOSPITAL_COMMUNITY)
Admission: EM | Admit: 2019-12-28 | Discharge: 2019-12-28 | Disposition: A | Discharge status: Home / Self Care | Payer: 59 | Attending: Emergency Medicine | Admitting: Emergency Medicine

## 2019-12-28 ENCOUNTER — Emergency Department (HOSPITAL_COMMUNITY): Payer: 59

## 2019-12-28 ENCOUNTER — Encounter (HOSPITAL_COMMUNITY): Payer: Self-pay

## 2019-12-28 DIAGNOSIS — W108XXA Fall (on) (from) other stairs and steps, initial encounter: Secondary | ICD-10-CM | POA: Insufficient documentation

## 2019-12-28 DIAGNOSIS — Z87891 Personal history of nicotine dependence: Secondary | ICD-10-CM | POA: Insufficient documentation

## 2019-12-28 DIAGNOSIS — S62115A Nondisplaced fracture of triquetrum [cuneiform] bone, left wrist, initial encounter for closed fracture: Secondary | ICD-10-CM | POA: Diagnosis not present

## 2019-12-28 DIAGNOSIS — Y9289 Other specified places as the place of occurrence of the external cause: Secondary | ICD-10-CM | POA: Insufficient documentation

## 2019-12-28 DIAGNOSIS — S6992XA Unspecified injury of left wrist, hand and finger(s), initial encounter: Secondary | ICD-10-CM | POA: Diagnosis present

## 2019-12-28 DIAGNOSIS — W19XXXA Unspecified fall, initial encounter: Secondary | ICD-10-CM

## 2019-12-28 NOTE — ED Notes (Signed)
An After Visit Summary was printed and given to the patient. Discharge instructions given and no further questions at this time.  

## 2019-12-28 NOTE — ED Provider Notes (Signed)
[facility]-EMERGENCY DEPT Provider Note   CSN: [id number] Arrival date & time: 12/28/19  1147     History Chief Complaint  Patient presents with  . Wrist Pain    left    Elaine Rodriguez is a 24 y.o. female.  HPI    Patient presents 3 days after mechanical fall, with pain in her left wrist. Patient is generally well, denies medical problems. She notes that she fell down a few steps, landing onto her left wrist 3 days ago.  Since that time she has had pain, inability to move the wrist secondary to this.  No relief with OTC medication. No distal loss of sensation entirely, nor complete inability to move any digit. No other injuries, no other complaints.  History reviewed. No pertinent past medical history.  Patient Active Problem List   Diagnosis Date Noted  . Costochondritis 07/11/2019  . Tobacco abuse counseling 07/11/2019    History reviewed. No pertinent surgical history.   OB History   No obstetric history on file.     Family History  Problem Relation Age of Onset  . Hypertension Mother   . Diabetes Mother   . Cancer Mother        Stage 4 breast cancer.  . Hypertension Father     Social History   Tobacco Use  . Smoking status: Former Smoker    Packs/day: 0.25    Years: 7.00    Pack years: 1.75    Types: Cigarettes  . Smokeless tobacco: Never Used  Vaping Use  . Vaping Use: Never used  Substance Use Topics  . Alcohol use: Yes    Comment: occ  . Drug use: Never    Home Medications Prior to Admission medications   Not on File    Allergies    Patient has no known allergies.  Review of Systems   Review of Systems  Constitutional:       Per HPI, otherwise negative  HENT:       Per HPI, otherwise negative  Respiratory:       Per HPI, otherwise negative  Cardiovascular:       Per HPI, otherwise negative  Gastrointestinal: Negative for vomiting.  Endocrine:       Negative aside from HPI  Genitourinary:       Neg  aside from HPI   Musculoskeletal:       Per HPI, otherwise negative  Skin: Negative.   Neurological: Negative for syncope.    Physical Exam Updated Vital Signs BP 123/71   Pulse 71   Temp 98 F (36.7 C) (Oral)   Resp 18   LMP 12/05/2019   SpO2 97%   Physical Exam Vitals and nursing note reviewed.  Constitutional:      General: She is not in acute distress.    Appearance: She is well-developed.  HENT:     Head: Normocephalic and atraumatic.  Eyes:     Conjunctiva/sclera: Conjunctivae normal.  Cardiovascular:     Rate and Rhythm: Normal rate and regular rhythm.  Pulmonary:     Effort: Pulmonary effort is normal. No respiratory distress.     Breath sounds: Normal breath sounds. No stridor.  Abdominal:     General: There is no distension.  Musculoskeletal:     Left shoulder: Normal.     Left elbow: Normal.     Left wrist: Tenderness and bony tenderness present. No snuff box tenderness or crepitus. Decreased range of motion. Normal pulse.  Comments: Patient can move all digits, and her wrist, and flexion and extension, though she is hesitant to move any of these secondary to pain in the palmar aspect of the proximal hand.  Sensation intact throughout.  Skin:    General: Skin is warm and dry.  Neurological:     Mental Status: She is alert and oriented to person, place, and time.     Cranial Nerves: No cranial nerve deficit.     ED Results / Procedures / Treatments    Radiology DG Wrist Complete Left  Result Date: 12/28/2019 CLINICAL DATA:  Pain following fall EXAM: LEFT WRIST - COMPLETE 3+ VIEW COMPARISON:  None. FINDINGS: Frontal, oblique, lateral, and ulnar deviation scaphoid images were obtained. There is a transverse lucency in the mid triquetrum bone concerning for fracture in this area. No other evident fracture. No dislocation. Joint spaces appear normal. No erosive change. IMPRESSION: Transverse lucency in the triquetrum bone concerning for nondisplaced  fracture in this area. No other appreciable fracture. No dislocation. No evident arthropathy. Electronically Signed   By: Bretta Bang III M.D.   On: 12/28/2019 12:54    Procedures Procedures (including critical care time)  On repeat exam the patient is awake and alert. With the orthopedic technician we applied a splint.  I discussed her case with our orthopedic colleagues for follow-up.  Patient aware of need for ongoing follow-up, monitoring, management, discharged in stable condition.   ED Course  I have reviewed the triage vital signs and the nursing notes.  Pertinent labs & imaging results that were available during my care of the patient were reviewed by me and considered in my medical decision making (see chart for details).  Healthy young female presents after mechanical follicle fall that occurred a few days ago, now is found to have wrist pain, swelling, fracture.  No evidence for compartment syndrome, distal loss of sensation or weakness.  Patient had appropriate stabilization was discharged in stable condition. Final Clinical Impression(s) / ED Diagnoses Final diagnoses:  Fall, initial encounter  Nondisplaced fracture of triquetrum (cuneiform) bone, left wrist, initial encounter for closed fracture     Gerhard Munch, MD 12/28/19 1339

## 2019-12-28 NOTE — ED Triage Notes (Addendum)
Pt fell down steps Friday.  C/o left wrist pain  7/10 Denies hitting head or LOC  A/Ox4 Ambulatory in triage

## 2019-12-28 NOTE — Progress Notes (Signed)
Orthopedic Tech Progress Note Patient Details:  Elaine Rodriguez August 22, 1995 [id number]  Ortho Devices Type of Ortho Device: Ace wrap, Volar splint Ortho Device/Splint Location: LUE Ortho Device/Splint Interventions: Ordered, Application   Post Interventions Patient Tolerated: Well Instructions Provided: Care of device   Jennye Moccasin 12/28/2019, 1:20 PM

## 2019-12-28 NOTE — Discharge Instructions (Signed)
As discussed, you have been diagnosed with a broken wrist.  Please be sure to follow-up with our orthopedic colleague.  Return here for concerning changes in your condition.

## 2020-01-29 ENCOUNTER — Encounter: Payer: Self-pay | Admitting: Physician Assistant

## 2020-02-23 ENCOUNTER — Ambulatory Visit: Payer: 59 | Admitting: Physician Assistant

## 2020-06-13 ENCOUNTER — Emergency Department (HOSPITAL_COMMUNITY)
Admission: EM | Admit: 2020-06-13 | Discharge: 2020-06-13 | Disposition: A | Discharge status: Home / Self Care | Payer: Medicaid Other | Attending: Emergency Medicine | Admitting: Emergency Medicine

## 2020-06-13 ENCOUNTER — Emergency Department (HOSPITAL_COMMUNITY): Payer: Medicaid Other

## 2020-06-13 ENCOUNTER — Other Ambulatory Visit: Payer: Self-pay

## 2020-06-13 DIAGNOSIS — Z87891 Personal history of nicotine dependence: Secondary | ICD-10-CM | POA: Insufficient documentation

## 2020-06-13 DIAGNOSIS — R0789 Other chest pain: Secondary | ICD-10-CM | POA: Insufficient documentation

## 2020-06-13 LAB — BASIC METABOLIC PANEL
Anion gap: 6 (ref 5–15)
BUN: 12 mg/dL (ref 6–20)
CO2: 27 mmol/L (ref 22–32)
Calcium: 9.6 mg/dL (ref 8.9–10.3)
Chloride: 106 mmol/L (ref 98–111)
Creatinine, Ser: 0.92 mg/dL (ref 0.44–1.00)
GFR, Estimated: >60 mL/min (ref >60)
Glucose, Bld: 104 mg/dL — ABNORMAL HIGH (ref 70–99)
Potassium: 4 mmol/L (ref 3.5–5.1)
Sodium: 139 mmol/L (ref 135–145)

## 2020-06-13 LAB — CBC
HCT: 38.3 % (ref 36.0–46.0)
Hemoglobin: 11.7 g/dL — ABNORMAL LOW (ref 12.0–15.0)
MCH: 26.1 pg (ref 26.0–34.0)
MCHC: 30.5 g/dL (ref 30.0–36.0)
MCV: 85.3 fL (ref 80.0–100.0)
Platelets: 212 10*3/uL (ref 150–400)
RBC: 4.49 MIL/uL (ref 3.87–5.11)
RDW: 13.3 % (ref 11.5–15.5)
WBC: 5.5 10*3/uL (ref 4.0–10.5)
nRBC: 0 % (ref 0.0–0.2)

## 2020-06-13 LAB — I-STAT BETA HCG BLOOD, ED (MC, WL, AP ONLY): I-stat hCG, quantitative: <5 m[IU]/mL (ref <5)

## 2020-06-13 LAB — TROPONIN I (HIGH SENSITIVITY)
Troponin I (High Sensitivity): 4 ng/L (ref <18)
Troponin I (High Sensitivity): 4 ng/L (ref <18)

## 2020-06-13 MED ORDER — KETOROLAC TROMETHAMINE 30 MG/ML IJ SOLN
30.0000 mg | Freq: Once | INTRAMUSCULAR | Status: AC
  Administered 2020-06-13: 30 mg via INTRAVENOUS
  Filled 2020-06-13: qty 1

## 2020-06-13 MED ORDER — METHOCARBAMOL 500 MG PO TABS: 500.0000 mg | ORAL_TABLET | Freq: Two times a day (BID) | ORAL | 0 refills | Status: DC

## 2020-06-13 NOTE — Discharge Instructions (Signed)
As we discussed, your work-up looked reassuring today.  As we discussed, this could be musculoskeletal pain that is contributing.  You can take Tylenol or Ibuprofen as directed for pain. You can alternate Tylenol and Ibuprofen every 4 hours. If you take Tylenol at 1pm, then you can take Ibuprofen at 5pm. Then you can take Tylenol again at 9pm.   Take Robaxin as prescribed. This medication will make you drowsy so do not drive or drink alcohol when taking it.  Return the emergency department any worsening pain, difficulty breathing, vomiting or any other worsening concerning symptoms.

## 2020-06-13 NOTE — ED Provider Notes (Signed)
[facility] EMERGENCY DEPARTMENT Provider Note   CSN: [id number] Arrival date & time: 06/13/20  9983     History Chief Complaint  Patient presents with  . Chest Pain    Elaine Rodriguez is a 25 y.o. female who presents for evaluation of chest pain that has been ongoing for 3 days.  Patient reports that she first noticed the pain 3 days ago while she was at work.  She states when the pain initially started, she did not have any nausea, vomiting, diaphoresis.  She states that it is been a constant pressure in the middle of her chest since then.  She states it will ease up but has never gone away completely.  It is not worse with deep inspiration.  She does not have any associated shortness of breath.  She states it is worse with movement, bending, laying down flat or getting up and walking around.  It is better if she sits up.  She sometimes has it when she is still as well.  She states she feels like it wraps around to her back but denies any radiation to neck, arms, jaw.  She has not take any medications for the pain.  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  She does not recall any specific injury, trauma.  She has not been sick recently with fever, cough, chills.  She denies any smoking, illicit drug use.  No personal cardiac history.  No family history of MI before the age of 27. She denies any OCP use, recent immobilization, prior history of DVT/PE, recent surgery, leg swelling, or long travel.  The history is provided by the patient.    HPI: A 25 year old patient with a history of obesity presents for evaluation of chest pain. Initial onset of pain was more than 6 hours ago. The patient's chest pain is not worse with exertion. The patient's chest pain is middle- or left-sided, is not well-localized, is not described as heaviness/pressure/tightness, is not sharp and does not radiate to the arms/jaw/neck. The patient does not complain of nausea and denies  diaphoresis. The patient has no history of stroke, has no history of peripheral artery disease, has not smoked in the past 90 days, denies any history of treated diabetes, has no relevant family history of coronary artery disease (first degree relative at less than age 75), is not hypertensive and has no history of hypercholesterolemia.   No past medical history on file.  Patient Active Problem List   Diagnosis Date Noted  . Costochondritis 07/11/2019  . Tobacco abuse counseling 07/11/2019    No past surgical history on file.   OB History   No obstetric history on file.     Family History  Problem Relation Age of Onset  . Hypertension Mother   . Diabetes Mother   . Cancer Mother        Stage 4 breast cancer.  . Hypertension Father     Social History   Tobacco Use  . Smoking status: Former Smoker    Packs/day: 0.25    Years: 7.00    Pack years: 1.75    Types: Cigarettes  . Smokeless tobacco: Never Used  Vaping Use  . Vaping Use: Never used  Substance Use Topics  . Alcohol use: Yes    Comment: occ  . Drug use: Never    Home Medications Prior to Admission medications   Medication Sig Start Date End Date Taking? Authorizing Provider  methocarbamol (  ROBAXIN) 500 MG tablet Take 1 tablet (500 mg total) by mouth 2 (two) times daily. 06/13/20  Yes Maxwell Caul, PA-C    Allergies    Patient has no known allergies.  Review of Systems   Review of Systems  Constitutional: Negative for fever.  Respiratory: Negative for cough and shortness of breath.   Cardiovascular: Positive for chest pain.  Gastrointestinal: Negative for abdominal pain, nausea and vomiting.  Genitourinary: Negative for dysuria and hematuria.  Neurological: Negative for headaches.  All other systems reviewed and are negative.   Physical Exam Updated Vital Signs BP (!) 137/53 (BP Location: Right Arm)   Pulse 69   Temp 98.3 F (36.8 C)   Resp 19   Ht 5\' 6"  (1.676 m)   Wt 95.3 kg   LMP  06/07/2020 (Exact Date)   SpO2 100%   BMI 33.89 kg/m   Physical Exam Vitals and nursing note reviewed.  Constitutional:      Appearance: Normal appearance. She is well-developed.  HENT:     Head: Normocephalic and atraumatic.  Eyes:     General: Lids are normal.     Conjunctiva/sclera: Conjunctivae normal.     Pupils: Pupils are equal, round, and reactive to light.  Cardiovascular:     Rate and Rhythm: Normal rate and regular rhythm.     Pulses: Normal pulses.     Heart sounds: Normal heart sounds. No murmur heard. No friction rub. No gallop.   Pulmonary:     Effort: Pulmonary effort is normal.     Breath sounds: Normal breath sounds.     Comments: Lungs clear to auscultation bilaterally.  Symmetric chest rise.  No wheezing, rales, rhonchi. Chest:     Comments: Pain reproduced with palpation of the anterior chest wall. No deformity or crepitus noted.  Abdominal:     Palpations: Abdomen is soft. Abdomen is not rigid.     Tenderness: There is no abdominal tenderness. There is no guarding.     Comments: Abdomen is soft, non-distended, non-tender. No rigidity, No guarding. No peritoneal signs.  Musculoskeletal:        General: Normal range of motion.     Cervical back: Full passive range of motion without pain.     Comments: BLE are symmetric in appearance without any overlying warmth, erythema or edema.   Skin:    General: Skin is warm and dry.     Capillary Refill: Capillary refill takes less than 2 seconds.  Neurological:     Mental Status: She is alert and oriented to person, place, and time.  Psychiatric:        Speech: Speech normal.     ED Results / Procedures / Treatments   Labs (all labs ordered are listed, but only abnormal results are displayed) Labs Reviewed  BASIC METABOLIC PANEL - Abnormal; Notable for the following components:      Result Value   Glucose, Bld 104 (*)    All other components within normal limits  CBC - Abnormal; Notable for the following  components:   Hemoglobin 11.7 (*)    All other components within normal limits  I-STAT BETA HCG BLOOD, ED ([facility], [facility], [facility] ONLY)  TROPONIN I (HIGH SENSITIVITY)  TROPONIN I (HIGH SENSITIVITY)    EKG EKG Interpretation  Date/Time:  Monday June 13 2020 09:34:28 EDT Ventricular Rate:  69 PR Interval:  142 QRS Duration: 76 QT Interval:  386 QTC Calculation: 413 R Axis:   67 Text Interpretation: Normal sinus  rhythm Possible Left atrial enlargement Borderline ECG No significant change since last tracing Confirmed by Melene Plan [phone]) on 06/13/2020 2:02:17 PM   Radiology DG Chest 2 View  Result Date: 06/13/2020 CLINICAL DATA:  Chest pain and shortness of breath EXAM: CHEST - 2 VIEW COMPARISON:  November 05, 2017 FINDINGS: The lungs are clear. The heart size and pulmonary vascularity are normal. No adenopathy. No pneumothorax. No bone lesions. IMPRESSION: Lungs clear.  Cardiac silhouette normal. Electronically Signed   By: Bretta Bang III M.D.   On: 06/13/2020 09:57    Procedures Procedures   Medications Ordered in ED Medications  ketorolac (TORADOL) 30 MG/ML injection 30 mg (30 mg Intravenous Given 06/13/20 1326)    ED Course  I have reviewed the triage vital signs and the nursing notes.  Pertinent labs & imaging results that were available during my care of the patient were reviewed by me and considered in my medical decision making (see chart for details).    MDM Rules/Calculators/A&P HEAR Score: 1                        25 year old female who presents for evaluation of chest pain that is been ongoing for the last 3 days.  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  It is in the middle of her chest and describes it as a pressure.  States it wraps around to her back.  It is not worse with deep inspiration.  No shortness of breath.  She has not had any associated nausea/vomiting.  On initial arrival, she is afebrile, nontoxic-appearing.   Vital signs are stable.  On exam, I became able to reproduce the pain with palpation of her anterior chest wall.  Lungs clear to auscultation.  No evidence of respiratory distress.  Bilateral lower extremities are symmetric in appearance.  Low suspicion for ACS etiology given that it has some atypical features.  History/physical exam not concerning for PE.  She does not have any recent PE risk factors, she is not tachycardic or hypoxic and does not have any pleuritic component to her chest pain.  Additionally, history/physical exam not concerning for dissection.  Initial troponin negative.  I-STAT beta negative.  BMP shows normal pain and creatinine.  CBC shows no leukocytosis.  Hemoglobin stable 11.7.  Chest x-ray negative for any infectious etiology.  Given patient's history/risk factors, she has a heart score 1.  Troponin negative.  Patient is hemodynamically stable.  At this time, she is to negative troponin, reassuring EKG after 3 days of constant pain.  Additionally, history/physical exam is not concerning for dissection.  She is well-appearing.  I suspect this is most likely musculoskeletal pain.  She does report that she lifts, works with patients and it is worse when she moves, bends and is reproducible on palpation.  We will plan to treat with muscle relaxers. Discussed patient with Dr. Adela Lank who is agreeable to plan. Discussed with patient.  She is agreeable to plan. At this time, patient exhibits no emergent life-threatening condition that require further evaluation in ED. Patient had ample opportunity for questions and discussion. All patient's questions were answered with full understanding. Strict return precautions discussed. Patient expresses understanding and agreement to plan.   Portions of this note were generated with Scientist, clinical (histocompatibility and immunogenetics). Dictation errors may occur despite best attempts at proofreading.   Final Clinical Impression(s) / ED Diagnoses Final diagnoses:   Atypical chest  pain    Rx / DC Orders ED Discharge Orders         Ordered    methocarbamol (ROBAXIN) 500 MG tablet  2 times daily        06/13/20 1451           Rosana HoesLayden, Braidyn Scorsone A, PA-C 06/13/20 1515    Melene PlanFloyd, Dan, DO 06/14/20 0710

## 2020-06-13 NOTE — ED Triage Notes (Signed)
C/o chest pain since saturday

## 2020-06-15 ENCOUNTER — Encounter: Payer: Self-pay | Admitting: Physician Assistant

## 2020-06-15 ENCOUNTER — Ambulatory Visit (INDEPENDENT_AMBULATORY_CARE_PROVIDER_SITE_OTHER): Payer: Self-pay | Admitting: Physician Assistant

## 2020-06-15 ENCOUNTER — Other Ambulatory Visit: Payer: Self-pay

## 2020-06-15 VITALS — BP 100/60 | HR 80 | Ht 66.0 in | Wt 209.2 lb

## 2020-06-15 DIAGNOSIS — K625 Hemorrhage of anus and rectum: Secondary | ICD-10-CM

## 2020-06-15 DIAGNOSIS — K641 Second degree hemorrhoids: Secondary | ICD-10-CM

## 2020-06-15 MED ORDER — HYDROCORTISONE (PERIANAL) 2.5 % EX CREA: 1.0000 "application " | TOPICAL_CREAM | Freq: Two times a day (BID) | CUTANEOUS | 1 refills | Status: DC

## 2020-06-15 NOTE — Patient Instructions (Signed)
If you are age 27 or older, your body mass index should be between 23-30. Your Body mass index is 33.77 kg/m. If this is out of the aforementioned range listed, please consider follow up with your Primary Care Provider.  If you are age 70 or younger, your body mass index should be between 19-25. Your Body mass index is 33.77 kg/m. If this is out of the aformentioned range listed, please consider follow up with your Primary Care Provider.   We have sent the following medications to your pharmacy for you to pick up at your convenience: Hydrocortisone ointment twice daily for 7 days with preporation H suppositories.  Thank you for choosing me and [facility].  Hyacinth Meeker, PA-C

## 2020-06-15 NOTE — Progress Notes (Signed)
Chief Complaint: Blood in stool  HPI:    Elaine Rodriguez is a 25 year old female with a past medical history as listed below, who was referred to me by Julieanne Manson, MD for a complaint of blood in stool.      06/13/2020 CBC with a hemoglobin of 11.7.  BMP with a glucose of 104 and otherwise normal.    Today, the patient tells me that since August she has been seeing some bright red blood on the toilet paper when wiping, but over the past 2 months now this has become more frequent and is almost every time that she has a bowel movement which is about once every couple of days.  She is now seeing it covering the entire toilet paper tissue and seeing it in the toilet.  Also describes occasional rectal discomfort when she is actually passing a stool.    Family history of colon cancer on her father's side, she is not sure exactly who.    Denies fever, chills, weight loss, change in bowel habits, constipation, diarrhea, nausea, vomiting or abdominal pain.  History reviewed. No pertinent past medical history.  Past Surgical History:  Procedure Laterality Date  . none      Current Outpatient Medications  Medication Sig Dispense Refill  . hydrocortisone (ANUSOL-HC) 2.5 % rectal cream Place 1 application rectally 2 (two) times daily. Place one application rectally twice daily for seven days. 30 g 1  . methocarbamol (ROBAXIN) 500 MG tablet Take 1 tablet (500 mg total) by mouth 2 (two) times daily. (Patient not taking: Reported on 06/15/2020) 20 tablet 0   No current facility-administered medications for this visit.    Allergies as of 06/15/2020  . (No Known Allergies)    Family History  Problem Relation Age of Onset  . Hypertension Mother   . Diabetes Mother   . Cancer Mother        Stage 4 breast cancer.  . Hypertension Father   . Colon cancer Paternal Aunt   . Cervical cancer Paternal Aunt   . Cancer Paternal Uncle        oral cancer (tongue)  . Esophageal cancer Neg Hx   .  Stomach cancer Neg Hx   . Pancreatic cancer Neg Hx     Social History   Socioeconomic History  . Marital status: Significant Other    Spouse name: Not on file  . Number of children: 0  . Years of education: Not on file  . Highest education level: High school graduate  Occupational History  . 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 Smoking status: Former Smoker    Packs/day: 0.25    Years: 7.00    Pack years: 1.75    Types: Cigarettes  . Smokeless tobacco: Never Used  Vaping Use  . Vaping Use: Never used  Substance and Sexual Activity  . Alcohol use: Yes    Comment: occ  . Drug use: Never  . Sexual activity: Yes    Birth control/protection: None    Comment: Lesbian  Other Topics Concern  . Not on file  Social History Narrative   Lives with her female partner.     They are engaged.   [employer], a dog, 2 birds, and 2 geckos.   Social Determinants of Health   Financial Resource Strain: Not on file  Food Insecurity: Not on file  Transportation Needs: Not on file  Physical Activity: Not on file  Stress:  Not on file  Social Connections: Not on file  Intimate Partner Violence: Not on file    Review of Systems:    Constitutional: No weight loss, fever or chills Skin: No rash  Cardiovascular: No chest pain  Respiratory: No SOB  Gastrointestinal: See HPI and otherwise negative Genitourinary: No dysuria  Neurological: No headache, dizziness or syncope Musculoskeletal: No new muscle or joint pain Hematologic: No bruising Psychiatric: No history of depression or anxiety   Physical Exam:  Vital signs: BP 100/60   Pulse 80   Ht 5\' 6"  (1.676 m)   Wt 209 lb 3.2 oz (94.9 kg)   LMP 06/07/2020 (Exact Date)   BMI 33.77 kg/m   Constitutional:   Pleasant overweight AA female appears to be in NAD, Well developed, Well nourished, alert and cooperative Head:  Normocephalic and atraumatic. Eyes:   PEERL, EOMI. No icterus. Conjunctiva  pink. Ears:  Normal auditory acuity. Neck:  Supple Throat: Oral cavity and pharynx without inflammation, swelling or lesion.  Respiratory: Respirations even and unlabored. Lungs clear to auscultation bilaterally.   No wheezes, crackles, or rhonchi.  Cardiovascular: Normal S1, S2. No MRG. Regular rate and rhythm. No peripheral edema, cyanosis or pallor.  Gastrointestinal:  Soft, nondistended, nontender. No rebound or guarding. Normal bowel sounds. No appreciable masses or hepatomegaly. Rectal: External: No hemorrhoids, no fissure, no tenderness to palpation; internal: No mass, no residue; anoscopy: Grade 2 internal hemorrhoids with friability and signs of recent bleeding Msk:  Symmetrical without gross deformities. Without edema, no deformity or joint abnormality.  Neurologic:  Alert and  oriented x4;  grossly normal neurologically.  Skin:   Dry and intact without significant lesions or rashes. Psychiatric: Demonstrates good judgement and reason without abnormal affect or behaviors.  RELEVANT LABS AND IMAGING: CBC    Component Value Date/Time   WBC 5.5 06/13/2020 0932   RBC 4.49 06/13/2020 0932   HGB 11.7 (L) 06/13/2020 0932   HCT 38.3 06/13/2020 0932   PLT 212 06/13/2020 0932   MCV 85.3 06/13/2020 0932   MCH 26.1 06/13/2020 0932   MCHC 30.5 06/13/2020 0932   RDW 13.3 06/13/2020 0932    CMP     Component Value Date/Time   NA 139 06/13/2020 0932   K 4.0 06/13/2020 0932   CL 106 06/13/2020 0932   CO2 27 06/13/2020 0932   GLUCOSE 104 (H) 06/13/2020 0932   BUN 12 06/13/2020 0932   CREATININE 0.92 06/13/2020 0932   CALCIUM 9.6 06/13/2020 0932   GFRNONAA >60 06/13/2020 0932   GFRAA >60 11/05/2017 0958    Assessment: 1.  Rectal bleeding: For the past 6 months, internal hemorrhoids with friability on exam which are likely the cause 2.  Grade 2 internal hemorrhoids: Likely the cause of above  Plan: 1.  Prescribed Hydrocortisone ointment to be applied Preparation H suppositories  twice daily x1 to 2 weeks.  Discussed not using Hydrocortisone for no longer than 2 weeks at a time.  If patient continues with bleeding after this then would recommend she call our office and may require colonoscopy for further investigation. 2.  Discussed keeping soft solid stools and avoiding sitting for long periods of time on the toilet/straining. 3.  Patient to follow in clinic as needed with 11/07/2017 in the future.  Assigned to Dr. [place] this afternoon.  Orvan Falconer, PA-C [facility] 06/15/2020, 2:52 PM  Cc: 06/17/2020, MD

## 2020-06-15 NOTE — Progress Notes (Signed)
Reviewed and agree with management plans. ? ?Jeanluc Wegman L. Yetzali Weld, MD, MPH  ?

## 2021-04-20 ENCOUNTER — Emergency Department (HOSPITAL_COMMUNITY): Payer: Self-pay

## 2021-04-20 ENCOUNTER — Other Ambulatory Visit: Payer: Self-pay

## 2021-04-20 ENCOUNTER — Emergency Department (HOSPITAL_COMMUNITY)
Admission: EM | Admit: 2021-04-20 | Discharge: 2021-04-21 | Disposition: A | Discharge status: Home / Self Care | Payer: Self-pay | Attending: Emergency Medicine | Admitting: Emergency Medicine

## 2021-04-20 DIAGNOSIS — D649 Anemia, unspecified: Secondary | ICD-10-CM | POA: Insufficient documentation

## 2021-04-20 DIAGNOSIS — N9489 Other specified conditions associated with female genital organs and menstrual cycle: Secondary | ICD-10-CM | POA: Insufficient documentation

## 2021-04-20 DIAGNOSIS — R102 Pelvic and perineal pain: Secondary | ICD-10-CM | POA: Insufficient documentation

## 2021-04-20 LAB — URINALYSIS, ROUTINE W REFLEX MICROSCOPIC
Bilirubin Urine: NEGATIVE
Glucose, UA: NEGATIVE mg/dL
Hgb urine dipstick: NEGATIVE
Ketones, ur: NEGATIVE mg/dL
Leukocytes,Ua: NEGATIVE
Nitrite: NEGATIVE
Protein, ur: NEGATIVE mg/dL
Specific Gravity, Urine: 1.025 (ref 1.005–1.030)
pH: 6 (ref 5.0–8.0)

## 2021-04-20 LAB — CBC
HCT: 37.8 % (ref 36.0–46.0)
Hemoglobin: 11.7 g/dL — ABNORMAL LOW (ref 12.0–15.0)
MCH: 26.7 pg (ref 26.0–34.0)
MCHC: 31 g/dL (ref 30.0–36.0)
MCV: 86.1 fL (ref 80.0–100.0)
Platelets: 216 10*3/uL (ref 150–400)
RBC: 4.39 MIL/uL (ref 3.87–5.11)
RDW: 12.6 % (ref 11.5–15.5)
WBC: 5.9 10*3/uL (ref 4.0–10.5)
nRBC: 0 % (ref 0.0–0.2)

## 2021-04-20 LAB — COMPREHENSIVE METABOLIC PANEL
ALT: 14 U/L (ref 0–44)
AST: 19 U/L (ref 15–41)
Albumin: 4.1 g/dL (ref 3.5–5.0)
Alkaline Phosphatase: 35 U/L — ABNORMAL LOW (ref 38–126)
Anion gap: 8 (ref 5–15)
BUN: 9 mg/dL (ref 6–20)
CO2: 24 mmol/L (ref 22–32)
Calcium: 9.2 mg/dL (ref 8.9–10.3)
Chloride: 105 mmol/L (ref 98–111)
Creatinine, Ser: 0.98 mg/dL (ref 0.44–1.00)
GFR, Estimated: >60 mL/min (ref >60)
Glucose, Bld: 89 mg/dL (ref 70–99)
Potassium: 3.6 mmol/L (ref 3.5–5.1)
Sodium: 137 mmol/L (ref 135–145)
Total Bilirubin: 0.2 mg/dL — ABNORMAL LOW (ref 0.3–1.2)
Total Protein: 7 g/dL (ref 6.5–8.1)

## 2021-04-20 LAB — LIPASE, BLOOD: Lipase: 41 U/L (ref 11–51)

## 2021-04-20 LAB — I-STAT BETA HCG BLOOD, ED (MC, WL, AP ONLY): I-stat hCG, quantitative: <5 m[IU]/mL (ref <5)

## 2021-04-20 NOTE — ED Provider Triage Note (Signed)
Emergency Medicine Provider Triage Evaluation Note  Elaine Rodriguez , a 26 y.o. female  was evaluated in triage.  Pt complains of lower abdominal pain and back pain.  No fevers, no N/V/D.  No abnormal discharge.   She reports that the pain is in the same location as her menstrual pain and cramps.   She reports that when she had this previously about 9 years ago it was a cyst that had ruptured.    Physical Exam  BP 120/74 (BP Location: Right Arm)    Pulse 67    Temp 99.1 F (37.3 C) (Oral)    Resp 16    LMP 04/19/2021 (Exact Date)    SpO2 100%  Gen:   Awake, no distress   Resp:  Normal effort  MSK:   Moves extremities without difficulty  Other:  TTP bilateral lower abdomen.   Medical Decision Making  Medically screening exam initiated at 8:14 PM.  Appropriate orders placed.  Alla Gruenhagen was informed that the remainder of the evaluation will be completed by another provider, this initial triage assessment does not replace that evaluation, and the importance of remaining in the ED until their evaluation is complete.  Discussed with patient, will order ultrasound.    Cristina Gong, [place] 04/20/21 2026

## 2021-04-20 NOTE — ED Triage Notes (Signed)
Pt with lower abdominal pain x 2 months but today pain has radiated to back. Denies n/v/d. Took tylenol at home without relief.

## 2021-04-21 LAB — GC/CHLAMYDIA PROBE AMP (~~LOC~~) NOT AT ARMC
Chlamydia: NEGATIVE
Comment: NEGATIVE
Comment: NORMAL
Neisseria Gonorrhea: NEGATIVE

## 2021-04-21 LAB — WET PREP, GENITAL
Sperm: NONE SEEN
Trich, Wet Prep: NONE SEEN
WBC, Wet Prep HPF POC: <10 (ref <10)
Yeast Wet Prep HPF POC: NONE SEEN

## 2021-04-21 LAB — RPR: RPR Ser Ql: NONREACTIVE

## 2021-04-21 LAB — HIV ANTIBODY (ROUTINE TESTING W REFLEX): HIV Screen 4th Generation wRfx: NONREACTIVE

## 2021-04-21 MED ORDER — DOXYCYCLINE HYCLATE 100 MG PO CAPS: 100.0000 mg | ORAL_CAPSULE | Freq: Two times a day (BID) | ORAL | 0 refills | Status: DC

## 2021-04-21 MED ORDER — DOXYCYCLINE HYCLATE 100 MG PO TABS
100.0000 mg | ORAL_TABLET | Freq: Once | ORAL | Status: AC
  Administered 2021-04-21: 100 mg via ORAL
  Filled 2021-04-21: qty 1

## 2021-04-21 MED ORDER — HYDROCODONE-ACETAMINOPHEN 5-325 MG PO TABS
1.0000 | ORAL_TABLET | Freq: Once | ORAL | Status: AC
  Administered 2021-04-21: 1 via ORAL
  Filled 2021-04-21: qty 1

## 2021-04-21 MED ORDER — CEFTRIAXONE SODIUM 500 MG IJ SOLR
500.0000 mg | Freq: Once | INTRAMUSCULAR | Status: AC
  Administered 2021-04-21: 500 mg via INTRAMUSCULAR
  Filled 2021-04-21: qty 500

## 2021-04-21 MED ORDER — METRONIDAZOLE 500 MG PO TABS: 500.0000 mg | ORAL_TABLET | Freq: Two times a day (BID) | ORAL | 0 refills | Status: DC

## 2021-04-21 MED ORDER — NAPROXEN 500 MG PO TABS: 500.0000 mg | ORAL_TABLET | Freq: Two times a day (BID) | ORAL | 0 refills | Status: DC

## 2021-04-21 MED ORDER — METRONIDAZOLE 500 MG PO TABS
500.0000 mg | ORAL_TABLET | Freq: Once | ORAL | Status: AC
  Administered 2021-04-21: 500 mg via ORAL
  Filled 2021-04-21: qty 1

## 2021-04-21 MED ORDER — NAPROXEN 250 MG PO TABS
500.0000 mg | ORAL_TABLET | Freq: Once | ORAL | Status: AC
Start: 2021-04-21 — End: 2021-04-21
  Administered 2021-04-21: 500 mg via ORAL
  Filled 2021-04-21: qty 2

## 2021-04-21 NOTE — ED Provider Notes (Signed)
[facility] EMERGENCY DEPARTMENT Provider Note   CSN: [id number] Arrival date & time: 04/20/21  1658     History  Chief Complaint  Patient presents with   Abdominal Pain    Elaine Rodriguez is a 26 y.o. female.  The history is provided by the patient.  Abdominal Pain She has no significant past history, comes in complaining of suprapubic pain which has been present for at least a year.  Pain is constant but getting worse.  She rates pain at 7/10.  It is worse when she urinates and worse with sex, also worse when she has her menses.  She denies any vaginal discharge.  She denies any nausea or vomiting.  She denies any dysuria.  She states that she is in a monogamous lesbian relationship and denies having sex with men.  She had seen her gynecologist regarding this and there was a suggestion that it might be polycystic ovarian syndrome.  She has tried taking acetaminophen which only gives slight, temporary relief.   Home Medications Prior to Admission medications   Medication Sig Start Date End Date Taking? Authorizing Provider  acetaminophen (TYLENOL) 500 MG tablet Take 500 mg by mouth every 6 (six) hours as needed.   Yes [provider]  hydrocortisone (ANUSOL-HC) 2.5 % rectal cream Place 1 application rectally 2 (two) times daily. Place one application rectally twice daily for seven days. Patient not taking: Reported on 04/20/2021 06/15/20   Unk Lightning, PA  methocarbamol (ROBAXIN) 500 MG tablet Take 1 tablet (500 mg total) by mouth 2 (two) times daily. Patient not taking: Reported on 06/15/2020 06/13/20   Maxwell Caul, PA-C      Allergies    Patient has no known allergies.    Review of Systems   Review of Systems  Gastrointestinal:  Positive for abdominal pain.  All other systems reviewed and are negative.  Physical Exam Updated Vital Signs BP 109/69 (BP Location: Right Arm)    Pulse 71    Temp 98.5 F (36.9 C) (Oral)    Resp 16    LMP  04/19/2021 (Exact Date)    SpO2 96%  Physical Exam Vitals and nursing note reviewed.  26 year old female, resting comfortably and in no acute distress. Vital signs are normal. Oxygen saturation is 96%, which is normal. Head is normocephalic and atraumatic. PERRLA, EOMI. Oropharynx is clear. Neck is nontender and supple without adenopathy or JVD. Back is nontender and there is no CVA tenderness. Lungs are clear without rales, wheezes, or rhonchi. Chest is nontender. Heart has regular rate and rhythm without murmur. Abdomen is soft, flat, with moderate suprapubic tenderness.  There is no focal tenderness.  There is no rebound or guarding.  Peristalsis is normoactive. Pelvic: Normal external female genitalia.  Cervix is closed, no significant discharge seen.  There is marked tenderness in both adnexa as well as mild cervical motion tenderness.  No masses are felt.  Uterus is normal size. Extremities have no cyanosis or edema, full range of motion is present. Skin is warm and dry without rash. Neurologic: Mental status is normal, cranial nerves are intact, moves all extremities equally.  ED Results / Procedures / Treatments   Labs (all labs ordered are listed, but only abnormal results are displayed) Labs Reviewed  WET PREP, GENITAL - Abnormal; Notable for the following components:      Result Value   Clue Cells Wet Prep HPF POC PRESENT (*)    All other components within  normal limits  COMPREHENSIVE METABOLIC PANEL - Abnormal; Notable for the following components:   Alkaline Phosphatase 35 (*)    Total Bilirubin 0.2 (*)    All other components within normal limits  CBC - Abnormal; Notable for the following components:   Hemoglobin 11.7 (*)    All other components within normal limits  LIPASE, BLOOD  URINALYSIS, ROUTINE W REFLEX MICROSCOPIC  RPR  HIV ANTIBODY (ROUTINE TESTING W REFLEX)  I-STAT BETA HCG BLOOD, ED ([facility], [facility], [facility] ONLY)  GC/CHLAMYDIA PROBE AMP ([place]) NOT AT [facility]     EKG None  Radiology US Transvaginal Non-OB  Result Date: 04/20/2021 CLINICAL DATA:  Pelvic pain.  Last menstrual period 04/15/2021. EXAM: TRANSABDOMINAL AND TRANSVAGINAL ULTRASOUND OF PELVIS DOPPLER ULTRASOUND OF OVARIES TECHNIQUE: Both transabdominal and transvaginal ultrasound examinations of the pelvis were performed. Transabdominal technique was performed for global imaging of the pelvis including uterus, ovaries, adnexal regions, and pelvic cul-de-sac. It was necessary to proceed with endovaginal exam following the transabdominal exam to visualize the left ovary. Color and duplex Doppler ultrasound was utilized to evaluate blood flow to the ovaries. COMPARISON:  None. FINDINGS: Uterus Measurements: 5.6 x 3.8 x 4.9 cm = volume: 55 mL. No fibroids or other mass visualized. Endometrium Thickness: 5 mm.  No focal abnormality visualized. Right ovary Measurements: 3.1 x 2.2 x 3 cm = volume: 10 mL. Normal appearance/no adnexal mass. Left ovary Measurements: 3.9 x 2.2 x 3.4 cm = volume: 15 mL. Normal appearance/no adnexal mass. Pulsed Doppler evaluation of both ovaries demonstrates normal low-resistance arterial and venous waveforms. Other findings No abnormal free fluid. IMPRESSION: Unremarkable pelvic ultrasound. Electronically Signed   By: Tish Frederickson M.D.   On: 04/20/2021 21:24   US Pelvis Complete  Result Date: 04/20/2021 CLINICAL DATA:  Pelvic pain.  Last menstrual period 04/15/2021. EXAM: TRANSABDOMINAL AND TRANSVAGINAL ULTRASOUND OF PELVIS DOPPLER ULTRASOUND OF OVARIES TECHNIQUE: Both transabdominal and transvaginal ultrasound examinations of the pelvis were performed. Transabdominal technique was performed for global imaging of the pelvis including uterus, ovaries, adnexal regions, and pelvic cul-de-sac. It was necessary to proceed with endovaginal exam following the transabdominal exam to visualize the left ovary. Color and duplex Doppler ultrasound was utilized to evaluate blood flow to the  ovaries. COMPARISON:  None. FINDINGS: Uterus Measurements: 5.6 x 3.8 x 4.9 cm = volume: 55 mL. No fibroids or other mass visualized. Endometrium Thickness: 5 mm.  No focal abnormality visualized. Right ovary Measurements: 3.1 x 2.2 x 3 cm = volume: 10 mL. Normal appearance/no adnexal mass. Left ovary Measurements: 3.9 x 2.2 x 3.4 cm = volume: 15 mL. Normal appearance/no adnexal mass. Pulsed Doppler evaluation of both ovaries demonstrates normal low-resistance arterial and venous waveforms. Other findings No abnormal free fluid. IMPRESSION: Unremarkable pelvic ultrasound. Electronically Signed   By: Tish Frederickson M.D.   On: 04/20/2021 21:24   [place] Art/Ven Flow Abd Pelv Doppler  Result Date: 04/20/2021 CLINICAL DATA:  Pelvic pain.  Last menstrual period 04/15/2021. EXAM: TRANSABDOMINAL AND TRANSVAGINAL ULTRASOUND OF PELVIS DOPPLER ULTRASOUND OF OVARIES TECHNIQUE: Both transabdominal and transvaginal ultrasound examinations of the pelvis were performed. Transabdominal technique was performed for global imaging of the pelvis including uterus, ovaries, adnexal regions, and pelvic cul-de-sac. It was necessary to proceed with endovaginal exam following the transabdominal exam to visualize the left ovary. Color and duplex Doppler ultrasound was utilized to evaluate blood flow to the ovaries. COMPARISON:  None. FINDINGS: Uterus Measurements: 5.6 x 3.8 x 4.9 cm = volume: 55 mL. No fibroids  or other mass visualized. Endometrium Thickness: 5 mm.  No focal abnormality visualized. Right ovary Measurements: 3.1 x 2.2 x 3 cm = volume: 10 mL. Normal appearance/no adnexal mass. Left ovary Measurements: 3.9 x 2.2 x 3.4 cm = volume: 15 mL. Normal appearance/no adnexal mass. Pulsed Doppler evaluation of both ovaries demonstrates normal low-resistance arterial and venous waveforms. Other findings No abnormal free fluid. IMPRESSION: Unremarkable pelvic ultrasound. Electronically Signed   By: Tish FredericksonMorgane  Naveau M.D.   On: 04/20/2021 21:24     Procedures Procedures    Medications Ordered in ED Medications  cefTRIAXone (ROCEPHIN) injection 500 mg (500 mg Intramuscular Given 04/21/21 0105)  doxycycline (VIBRA-TABS) tablet 100 mg (100 mg Oral Given 04/21/21 0104)  metroNIDAZOLE (FLAGYL) tablet 500 mg (500 mg Oral Given 04/21/21 0104)  naproxen (NAPROSYN) tablet 500 mg (500 mg Oral Given 04/21/21 0113)  HYDROcodone-acetaminophen (NORCO/VICODIN) 5-325 MG per tablet 1 tablet (1 tablet Oral Given 04/21/21 0113)    ED Course/ Medical Decision Making/ A&P                           Medical Decision Making Amount and/or Complexity of Data Reviewed Labs: ordered.   Pelvic pain which is now chronic.  Consider polycystic ovarian syndrome, endometriosis, pelvic inflammatory disease.  Labs are significant for mild anemia which is unchanged from prior.  WBC is normal, metabolic panel is normal.  Pregnancy test is negative and urinalysis is normal.  Pelvic ultrasound is obtained and shows no pathology.  I have independently viewed the images and agree with the radiologist's interpretation.  Old records are reviewed, and her gynecology evaluation where PCOS was discussed was in August 2021.  Chronicity of symptoms is more suggestive of endometriosis.  PCOS is not likely given failure of ultrasound to show any ovarian cysts.  We will check pelvic exam, refer back to her gynecologist.  We will give her a therapeutic trial of naproxen to see if NSAIDs give her better pain relief.  Pelvic exam is more consistent with endometriosis and pelvic inflammatory disease, but will treat empirically with dose of ceftriaxone and outpatient prescriptions for metronidazole and doxycycline.  Wet prep is positive for clue cells, but she does not clinically have bacterial vaginosis.  However, treatment for PID would treat bacterial vaginosis.  She is discharged with the above-noted prescriptions as well as prescription for naproxen.  Told to supplement with acetaminophen to  get additional pain relief.  She will need to follow-up with her nurse midwife for further outpatient work-up.  Return precautions discussed.        Final Clinical Impression(s) / ED Diagnoses Final diagnoses:  Pelvic pain in female  Normochromic normocytic anemia    Rx / DC Orders ED Discharge Orders          Ordered    doxycycline (VIBRAMYCIN) 100 MG capsule  2 times daily        04/21/21 0120    metroNIDAZOLE (FLAGYL) 500 MG tablet  2 times daily        04/21/21 0120    naproxen (NAPROSYN) 500 MG tablet  2 times daily        04/21/21 0120              Dione BoozeGlick, Rydan Gulyas, MD 04/21/21 [id number]

## 2021-04-21 NOTE — ED Notes (Signed)
E-signature pad unavailable at time of pt discharge. This RN discussed discharge materials with pt and answered all pt questions. Pt stated understanding of discharge material. ? ?

## 2021-04-21 NOTE — ED Notes (Signed)
EDP at provider bedside

## 2021-04-21 NOTE — ED Notes (Signed)
This RN witnessed provider perform pelvic exam in room.

## 2021-04-21 NOTE — Discharge Instructions (Signed)
Your evaluation in the emergency department did not show a clear cause for your pain.  I am concerned that she may have endometriosis.  In the meantime, you are being treated for possible pelvic infection.  Please follow-up with your nurse midwife for further outpatient evaluation of your pain.  You have been given a prescription for naproxen for pain.  This is the prescription strength of Aleve.  You may add acetaminophen (Tylenol) to get additional pain relief.  Return to the emergency department if your symptoms are getting worse.

## 2021-04-24 ENCOUNTER — Telehealth: Payer: Self-pay | Admitting: Obstetrics and Gynecology

## 2021-04-24 NOTE — Telephone Encounter (Signed)
Reached out to patient to get her scheduled for abdominal pain. She stated she was in a meeting, and needed to call me back.

## 2021-12-12 ENCOUNTER — Ambulatory Visit (INDEPENDENT_AMBULATORY_CARE_PROVIDER_SITE_OTHER): Payer: BLUE CROSS/BLUE SHIELD | Admitting: Internal Medicine

## 2021-12-12 ENCOUNTER — Encounter: Payer: Self-pay | Admitting: Internal Medicine

## 2021-12-12 VITALS — BP 110/60 | HR 89 | Temp 97.6°F | Resp 12 | Ht 66.54 in | Wt 198.6 lb

## 2021-12-12 DIAGNOSIS — H1013 Acute atopic conjunctivitis, bilateral: Secondary | ICD-10-CM | POA: Diagnosis not present

## 2021-12-12 DIAGNOSIS — Z91018 Allergy to other foods: Secondary | ICD-10-CM | POA: Diagnosis not present

## 2021-12-12 DIAGNOSIS — J310 Chronic rhinitis: Secondary | ICD-10-CM

## 2021-12-12 DIAGNOSIS — L500 Allergic urticaria: Secondary | ICD-10-CM | POA: Diagnosis not present

## 2021-12-12 MED ORDER — CETIRIZINE HCL 10 MG PO TABS: 10.0000 mg | ORAL_TABLET | Freq: Every day | ORAL | 2 refills | Status: DC | PRN

## 2021-12-12 MED ORDER — OLOPATADINE HCL 0.6 % NA SOLN: 2.0000 | Freq: Two times a day (BID) | NASAL | 2 refills | Status: DC

## 2021-12-12 MED ORDER — FLUTICASONE PROPIONATE 50 MCG/ACT NA SUSP: 2.0000 | Freq: Every day | NASAL | 2 refills | Status: DC

## 2021-12-12 NOTE — Patient Instructions (Addendum)
Rhinitis: - Avoidance measures discussed. - Use nasal saline rinses before nose sprays such as with Neilmed Sinus Rinse.  Use distilled water.   - Use Flonase 2 sprays each nostril daily. Aim upward and outward. - Use Olopatadine sprays each nostril twice daily. Aim upward and outward. - Use Zyrtec 10 mg daily as needed.  Food Allergy: - Avoid shellfish for now.  If blood testing is negative, then we can set up an in office challenge.

## 2021-12-12 NOTE — Progress Notes (Signed)
NEW PATIENT  Date of Service/Encounter:  12/12/21  Consult requested by: Mack Hook, MD   Subjective:   Elaine Rodriguez (DOB: [birth year]) is a 26 y.o. female who presents to the clinic on 12/12/2021 with a chief complaint of Allergy Testing (Possible seafood, fragrances, /Environmental and seafood testing ) .    History obtained from: chart review and patient.   Rhinitis:  Started around 20s Symptoms include: nasal congestion, rhinorrhea, post nasal drainage, sneezing, watery eyes, itchy eyes, and itchy nose  Occurs year-round Potential triggers: pollen Treatments tried: Flonase PRN and Zyrtec PRN. Last use of anti-histamines was 3 weeks ago.   Previous allergy testing: no History of reflux/heartburn: no History of chronic sinusitis or sinus surgery: no  Concern for Food Allergy:  Foods of concern: shrimp, mussels, crab History of reaction: within a few minutes of eating a mixed shellfish boil, she developed flushing, rosy rash on cheeks and face.  This occurred 3 months ago. Since then she has avoided it.  No other symptoms.  Previous allergy testing no Eats egg, dairy, wheat, soy, fish, shellfish, peanuts, tree nuts, sesame without reactions. She has lactose intolerance.   Carries an epinephrine autoinjector: no   Past Medical History: History reviewed. No pertinent past medical history.  Past Surgical History: Past Surgical History:  Procedure Laterality Date   none      Family History: Family History  Problem Relation Age of Onset   Hypertension Mother    Diabetes Mother    Cancer Mother        Stage 4 breast cancer.   Hypertension Father    Asthma Sister    Eczema Sister    Colon cancer Paternal Aunt    Cervical cancer Paternal Aunt    Cancer Paternal Uncle        oral cancer (tongue)   Esophageal cancer Neg Hx    Stomach cancer Neg Hx    Pancreatic cancer Neg Hx     Social History:  Lives in an unknown year apartment Flooring in bedroom:  tile Pets: dog Tobacco use/exposure: vapes Job: care taker  Medication List:  Allergies as of 12/12/2021   No Known Allergies      Medication List        Accurate as of December 12, 2021  4:54 PM. If you have any questions, ask your nurse or doctor.          acetaminophen 500 MG tablet Commonly known as: TYLENOL Take 500 mg by mouth every 6 (six) hours as needed.   cetirizine 10 MG tablet Commonly known as: ZYRTEC Take 1 tablet (10 mg total) by mouth daily as needed for allergies. Started by: Larose Kells, MD   doxycycline 100 MG capsule Commonly known as: VIBRAMYCIN Take 1 capsule (100 mg total) by mouth 2 (two) times daily. One po bid x 7 days   fluticasone 50 MCG/ACT nasal spray Commonly known as: FLONASE Place 2 sprays into both nostrils daily. Started by: Larose Kells, MD   hydrocortisone 2.5 % rectal cream Commonly known as: ANUSOL-HC Place 1 application rectally 2 (two) times daily. Place one application rectally twice daily for seven days.   methocarbamol 500 MG tablet Commonly known as: ROBAXIN Take 1 tablet (500 mg total) by mouth 2 (two) times daily.   metroNIDAZOLE 500 MG tablet Commonly known as: FLAGYL Take 1 tablet (500 mg total) by mouth 2 (two) times daily.   naproxen 500 MG tablet Commonly known as: NAPROSYN Take 1 tablet (  500 mg total) by mouth 2 (two) times daily.   Olopatadine HCl 0.6 % Soln Place 2 sprays into the nose in the morning and at bedtime. Started by: Larose Kells, MD         REVIEW OF SYSTEMS: Pertinent positives and negatives discussed in HPI.   Objective:   Physical Exam: BP 110/60   Pulse 89   Temp 97.6 F (36.4 C) (Temporal)   Resp 12   Ht 5' 6.54" (1.69 m)   Wt 198 lb 9.6 oz (90.1 kg)   SpO2 96%   BMI 31.54 kg/m  Body mass index is 31.54 kg/m. GEN: alert, well developed HEENT: clear conjunctiva, TM grey and translucent, nose with + inferior turbinate hypertrophy, pale nasal mucosa, slight clear  rhinorrhea, + cobblestoning HEART: regular rate and rhythm, no murmur LUNGS: clear to auscultation bilaterally, no coughing, unlabored respiration ABDOMEN: soft, non distended  SKIN: no rashes or lesions  Reviewed: none   Skin Testing:  Skin prick testing was placed, which includes aeroallergens/foods, histamine control, and saline control.  Verbal consent was obtained prior to placing test.  We discussed risks including anaphylaxis. Patient tolerated procedure well.  Allergy testing results were read and interpreted by myself, documented by clinical staff. Adequate positive and negative control.  Results discussed with patient/family.  Airborne Adult Perc - 12/12/21 1403     Time Antigen Placed 1408    Allergen Manufacturer Lavella Hammock    Location Back    Number of Test 59    1. Control-Buffer 50% Glycerol Negative    2. Control-Histamine 1 mg/ml 2+    3. Albumin saline Negative    4. Rincon Negative    5. [place] Negative    6. Johnson Negative    7. Langley Blue Negative    8. Meadow Fescue Negative    9. Perennial Rye Negative    10. Sweet Vernal Negative    11. Timothy Negative    12. Cocklebur Negative    13. Burweed Marshelder Negative    14. Ragweed, short Negative    15. Ragweed, Giant Negative    16. Plantain,  English Negative    17. Lamb's Quarters Negative    18. Sheep Sorrell Negative    19. Rough Pigweed Negative    20. Marsh Elder, Rough Negative    21. Mugwort, Common Negative    22. Ash mix Negative    23. Birch mix Negative    24. Beech American Negative    25. Box, Elder Negative    26. Cedar, red Negative    27. Cottonwood, [place] Negative    28. Elm mix Negative    29. Hickory Negative    30. Maple mix Negative    31. Oak, [place] mix Negative    32. Pecan Pollen Negative    33. Pine mix Negative    34. Sycamore Eastern Negative    35. Alger, Black Pollen Negative    36. Alternaria alternata Negative    37. Cladosporium Herbarum Negative     38. Aspergillus mix Negative    39. Penicillium mix Negative    40. Bipolaris sorokiniana (Helminthosporium) Negative    41. Drechslera spicifera (Curvularia) Negative    42. Mucor plumbeus Negative    43. Fusarium moniliforme Negative    44. Aureobasidium pullulans (pullulara) Negative    45. Rhizopus oryzae Negative    46. Botrytis cinera Negative    47. Epicoccum nigrum Negative    48. Phoma betae Negative  49. Candida Albicans Negative    50. Trichophyton mentagrophytes Negative    51. Mite, D Farinae  5,000 AU/ml Negative    52. Mite, D Pteronyssinus  5,000 AU/ml Negative    53. Cat Hair 10,000 BAU/ml Negative    54.  Dog Epithelia Negative    55. Mixed Feathers Negative    56. Horse Epithelia Negative    57. Cockroach, German Negative    58. Mouse Negative    59. Tobacco Leaf Negative             Intradermal - 12/12/21 1459     Time Antigen Placed 1504    Allergen Manufacturer Lavella Hammock    Location Arm    Number of Test 15    Control Negative    Guatemala Negative    Johnson Negative    7 Grass Negative    Ragweed mix Negative    Weed mix Negative    Tree mix Negative    Mold 1 Negative    Mold 2 Negative    Mold 3 Negative    Mold 4 Negative    Cat Negative    Dog Negative    Cockroach Negative    Mite mix Negative             Food Adult Perc - 12/12/21 1400     Time Antigen Placed 1418    Allergen Manufacturer Lavella Hammock    Location Back    Number of allergen test 5    25. Shrimp Negative    26. Crab Negative    27. Lobster Negative    28. Oyster Negative    29. Scallops Negative               Assessment:   1. Food allergy   2. Allergic urticaria   3. Chronic rhinitis     Plan/Recommendations:   Chronic Rhinitis Allergic Conjunctivitis - Use nasal saline rinses before nose sprays such as with Neilmed Sinus Rinse.  Use distilled water.   - Use Flonase 2 sprays each nostril daily. Aim upward and outward. - Use Olopatadine sprays  each nostril twice daily. Aim upward and outward. - Use Zyrtec 10 mg daily as needed.  Food Allergy: - Avoid shellfish for now.  If blood testing is negative, then we can set up an in office challenge.    Return in about 3 months (around 03/13/2022).  Harlon Flor, MD [facility] [place] [facility] [place]

## 2021-12-15 LAB — ALLERGEN PROFILE, SHELLFISH
Clam IgE: <0.1 kU/L
F023-IgE Crab: <0.1 kU/L
F080-IgE Lobster: <0.1 kU/L
F290-IgE Oyster: <0.1 kU/L
Scallop IgE: <0.1 kU/L
Shrimp IgE: <0.1 kU/L

## 2021-12-28 NOTE — Progress Notes (Deleted)
   [place] [id number] Dept: [phone]  FOLLOW UP NOTE  Patient ID: Elaine Rodriguez, female    DOB: 1996/01/21  Age: 26 y.o. MRN: [id number] Date of Office Visit: 12/29/2021  Assessment  Chief Complaint: No chief complaint on file.  HPI Elaine Rodriguez is a 26 year old female who presents to the clinic for follow-up visit with possible food challenge.  She was last seen in this clinic on 12/12/2021 by Dr. Posey Pronto for evaluation of chronic rhinitis and possible food allergy to shellfish.  She had environmental allergy skin testing on 12/12/2021 that was negative to the adult environmental panel and food allergy skin testing that was negative to shellfish.  Her last lab testing for shellfish allergy on 12/12/2021 was negative to the shellfish panel.   Drug Allergies:  No Known Allergies  Physical Exam: There were no vitals taken for this visit.   Physical Exam   Procedure note: Written consent obtained {Blank single:19197::"Open graded *** challenge","Open graded *** oral challenge"}: The patient was able to tolerate the challenge today without adverse signs or symptoms. Vital signs were stable throughout the challenge and observation period. She received multiple doses separated by {Blank single:19197::"30 minutes","20 minutes","15 minutes","10 minutes"}, each of which was separated by vitals and a brief physical exam. She received the following doses: lip rub, 1 gm, 2 gm, 4 gm, 8 gm, and 16 gm. She was monitored for 60 minutes following the last dose.   The patient had {Blank single:19197::"***","negative skin prick test and sIgE tests to ***","negative sIgE tests to ***","negative skin prick tests to ***"} and was able to tolerate the open graded oral challenge today without adverse signs or symptoms. Therefore, she has the same risk of systemic reaction associated with {Blank single:19197::"***","the consumption of ***"} as the general population.   Assessment and  Plan: No diagnosis found.  No orders of the defined types were placed in this encounter.   There are no Patient Instructions on file for this visit.  No follow-ups on file.    Thank you for the opportunity to care for this patient.  Please do not hesitate to contact me with questions.  Gareth Morgan, FNP [facility] [place] [facility] [place]

## 2021-12-29 ENCOUNTER — Encounter: Payer: BLUE CROSS/BLUE SHIELD | Admitting: Family Medicine

## 2021-12-29 DIAGNOSIS — J309 Allergic rhinitis, unspecified: Secondary | ICD-10-CM

## 2022-01-22 ENCOUNTER — Other Ambulatory Visit (HOSPITAL_COMMUNITY): Payer: Self-pay

## 2022-01-22 ENCOUNTER — Telehealth: Payer: Self-pay

## 2022-01-22 NOTE — Telephone Encounter (Signed)
Patient Advocate Encounter   Received notification from [employer] that prior authorization is required for Olopatadine HCl 0.6% solution  Submitted: 01-22-2022 Key [id number]  Status is pending

## 2022-01-23 NOTE — Telephone Encounter (Signed)
Received request from insurance to provide additional information. Form filled out and faxed back to insurance at [phone], which was the provided number, on 01-23-2022.  Awaiting determination response.

## 2022-01-24 NOTE — Telephone Encounter (Signed)
Received a fax from [employer] regarding Prior Authorization for Olopatadine HCl 0.6% solution.   Authorization has been DENIED due to not meeting the following requirements:  The requested medication is not a covered benefit. The U.S. Food and Drug Administration (FDA) has not approved this medication for use In your condition, and the clinical information submitted by your doctor does not meet the criteria established for the off-label drug use, in accordance with the terms and conditions of your plan benefit. [id number] case has been denied for olopatadine spr 0.6% use as directed, for the following reason: Your plan does not allow off-label treatment of your diagnosis of a long term, always occurring irritation and swelling within your nose (chronic rhinitis). Medication coverage requires that the requested drug has been recognized for the treatment of your medical condition(s) by the Food and Drug Administration (FDA) and the plan approves the drug for coverage in this particular case.  Determination letter attached to chart

## 2022-01-24 NOTE — Telephone Encounter (Signed)
PA DENIED FOR OLOPATADINE 0.6% PLEASE ADVISE TO CHANGE IN THERAPY

## 2022-01-25 NOTE — Telephone Encounter (Signed)
Re enter diagnosis code as other allergic rhinitis

## 2022-01-29 ENCOUNTER — Other Ambulatory Visit (HOSPITAL_COMMUNITY): Payer: Self-pay

## 2022-01-29 NOTE — Telephone Encounter (Signed)
Patient Advocate Encounter  Resubmitting prior authorization request for Olopatadine HCI 0.6% solution per MD request.   KeyJim Like Submitted: 01-29-2022  Awaiting determination

## 2022-01-30 NOTE — Telephone Encounter (Signed)
Received a fax from [employer] regarding Prior Authorization for Olopatadine HCl 0.6% solution.   Authorization has been DENIED due to not meeting the following requirements:  The requested medication is not a covered benefit. The U.S. Food and Drug Administration (FDA) has not approved this medication for use In your condition, and the clinical information submitted by your doctor does not meet the criteria established for the off-label drug use, in accordance with the terms and conditions of your plan benefit. [id number] case has been denied for olopatadine spr 0.6% use as directed, for the following reason: Your plan does not allow off-label treatment of your diagnosis of a long term, always occurring irritation and swelling within your nose (chronic rhinitis). Medication coverage requires that the requested drug has been recognized for the treatment of your medical condition(s) by the Food and Drug Administration (FDA) and the plan approves the drug for coverage in this particular case.  Please note that this prior authorization form does not ask for a diagnosis. It was resubmitted with more information to support the patient's case.

## 2022-01-31 MED ORDER — AZELASTINE HCL 0.1 % NA SOLN: 2.0000 | Freq: Two times a day (BID) | NASAL | 5 refills | Status: DC

## 2022-01-31 NOTE — Telephone Encounter (Signed)
Olopatadine nose spray denied.  Can try Azelastine 2 sprays each nostril twice daily.  This is available over the counter if not covered by insurance. Will send in the prescription.

## 2022-01-31 NOTE — Telephone Encounter (Signed)
Pa was denied please advise to change in therapy  

## 2022-01-31 NOTE — Addendum Note (Signed)
Addended byAlesia Morin on: 01/31/2022 11:40 AM   Modules accepted: Orders

## 2022-02-09 IMAGING — US US PELVIS COMPLETE
1 series · 13 of 25 positions shown · non-contrast
Comparison: None.

CLINICAL DATA: Pelvic pain.  Last menstrual period 04/15/2021.

EXAM:
TRANSABDOMINAL AND TRANSVAGINAL ULTRASOUND OF PELVIS
DOPPLER ULTRASOUND OF OVARIES
TECHNIQUE: Both transabdominal and transvaginal ultrasound examinations of the
pelvis were performed. Transabdominal technique was performed for
global imaging of the pelvis including uterus, ovaries, adnexal
regions, and pelvic cul-de-sac.
It was necessary to proceed with endovaginal exam following the
transabdominal exam to visualize the left ovary. Color and duplex
Doppler ultrasound was utilized to evaluate blood flow to the
ovaries.

[Series 1: us pelvis (transabdominal only) · 13 of 67 slices shown]
[im 1/67]
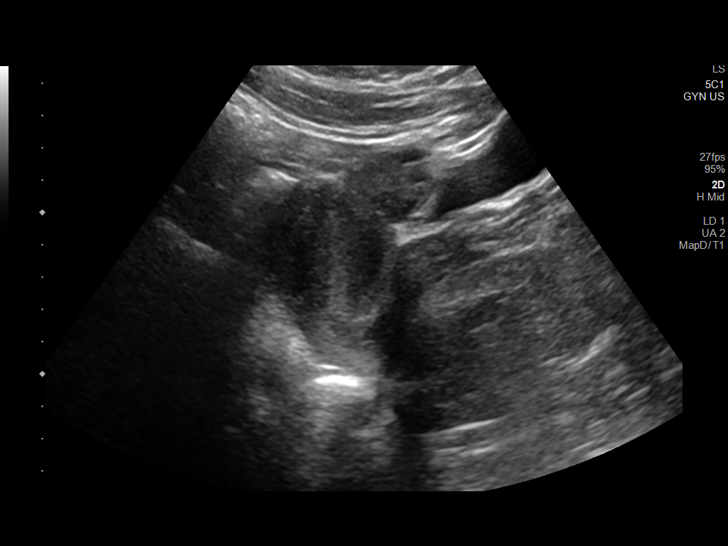
[im 6/67]
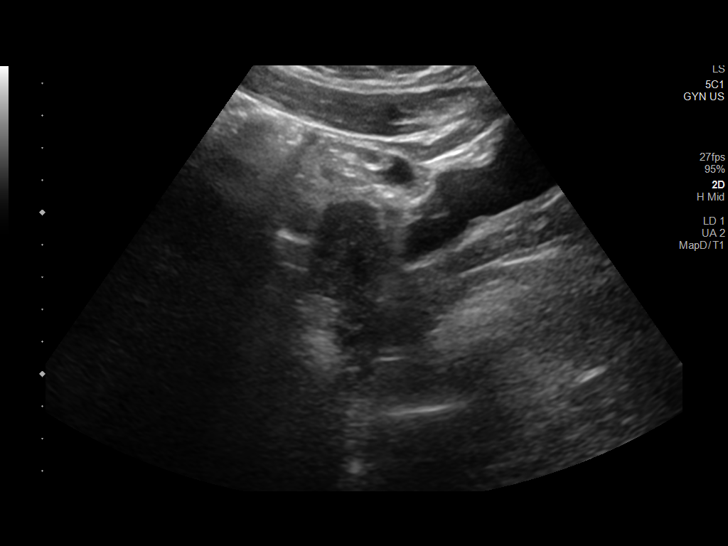
[im 12/67]
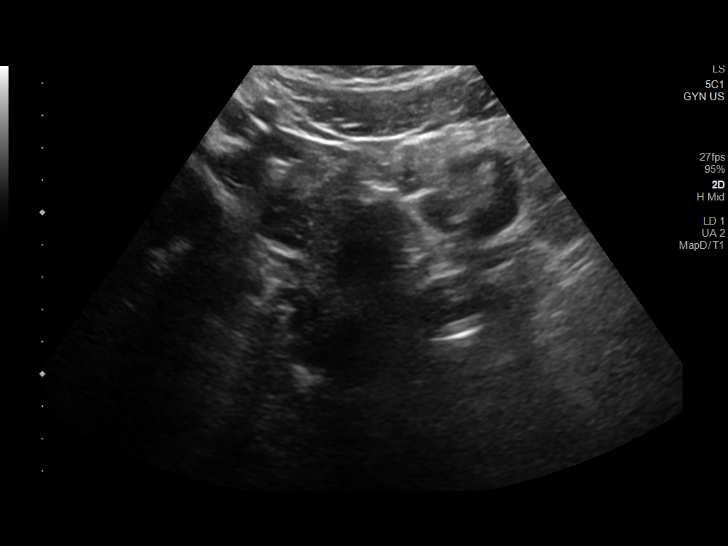
[im 17/67]
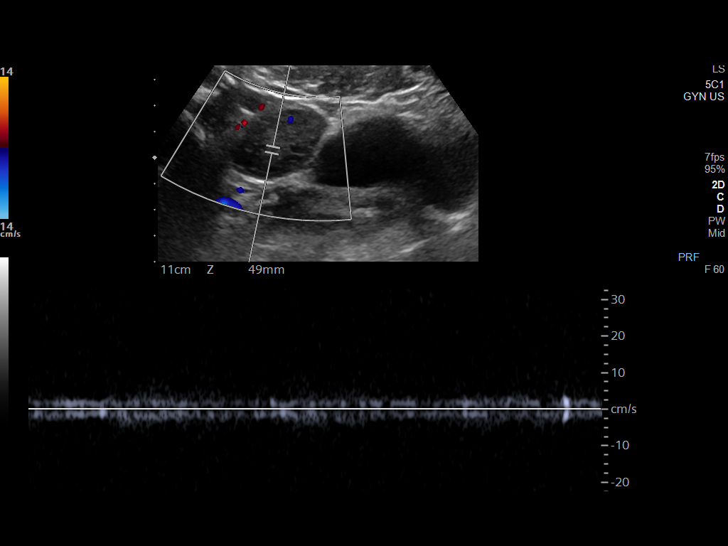
[im 23/67]
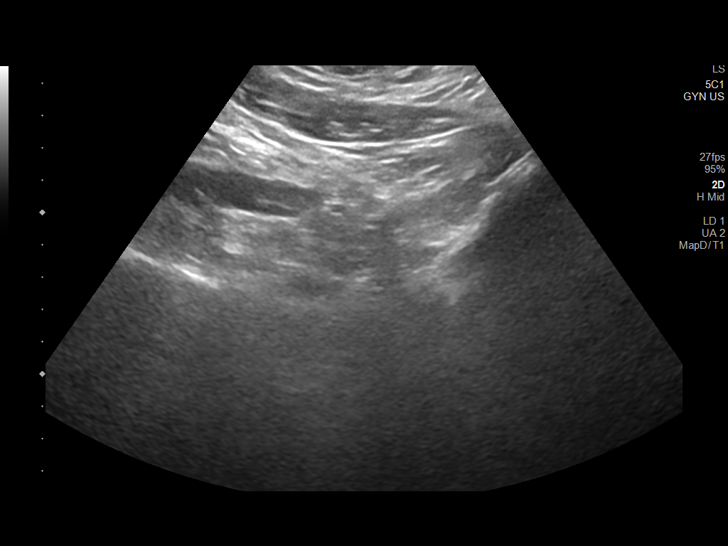
[im 28/67]
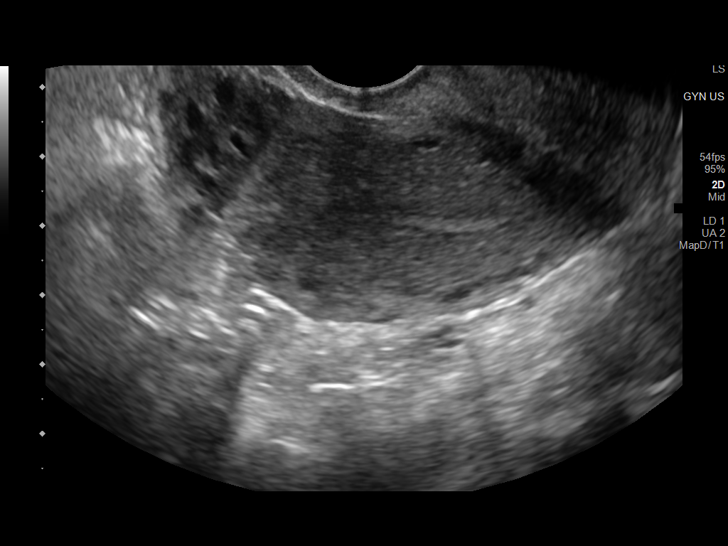
[im 34/67]
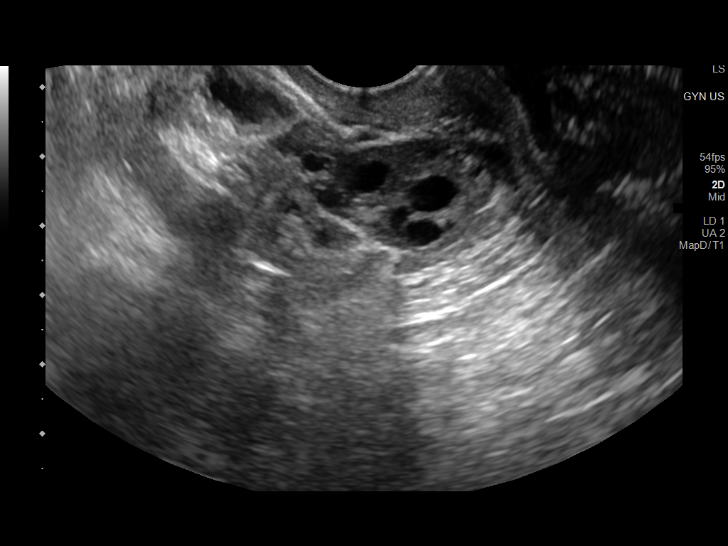
[im 39/67]
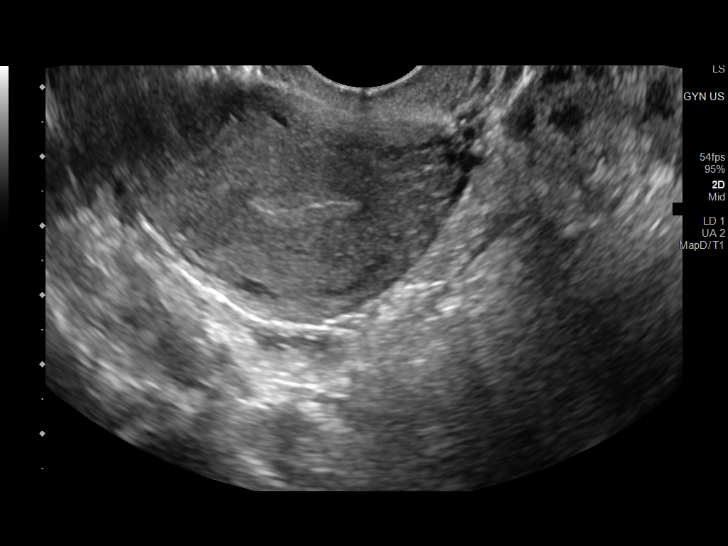
[im 45/67]
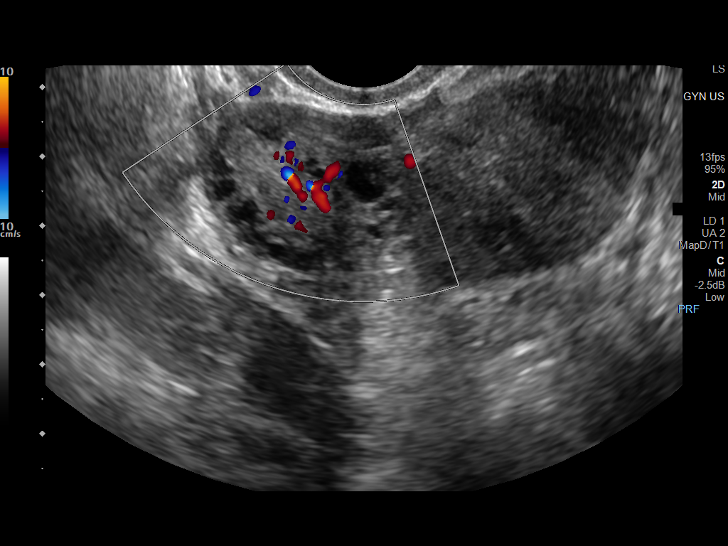
[im 50/67]
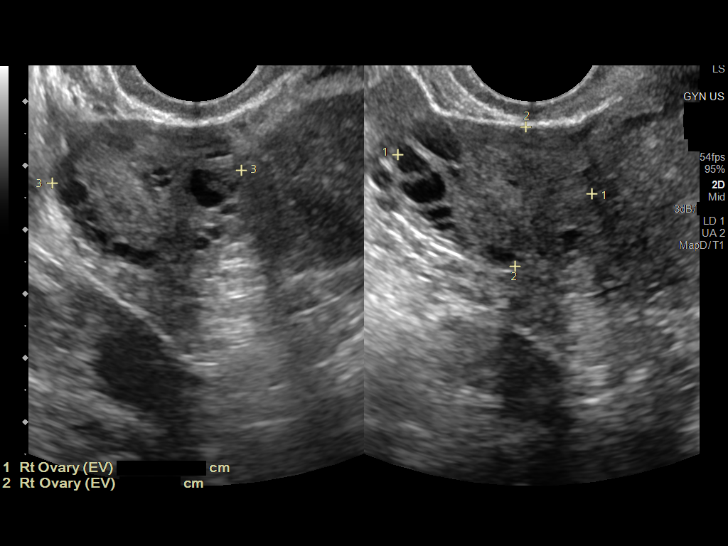
[im 56/67]
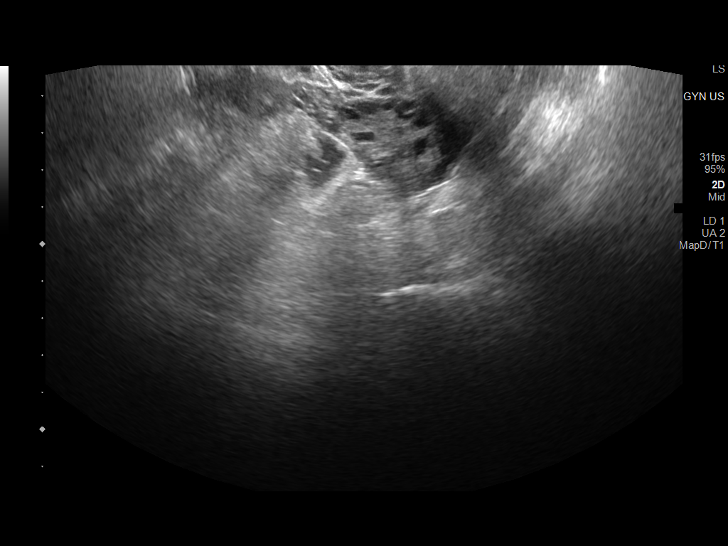
[im 61/67]
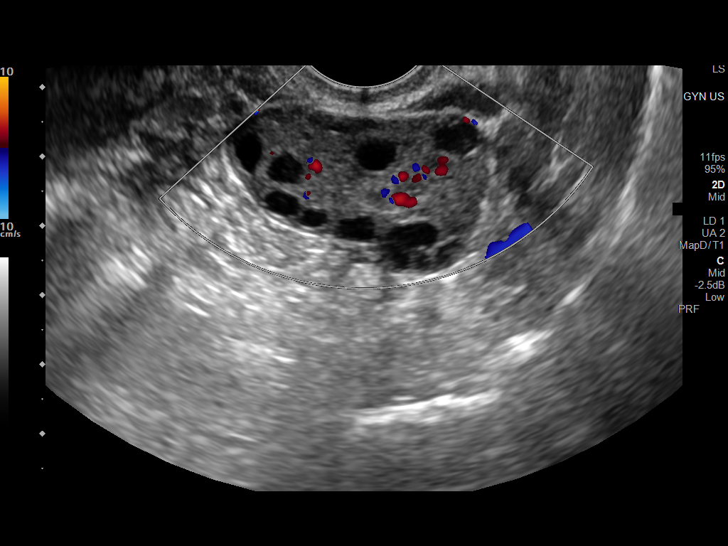
[im 67/67]
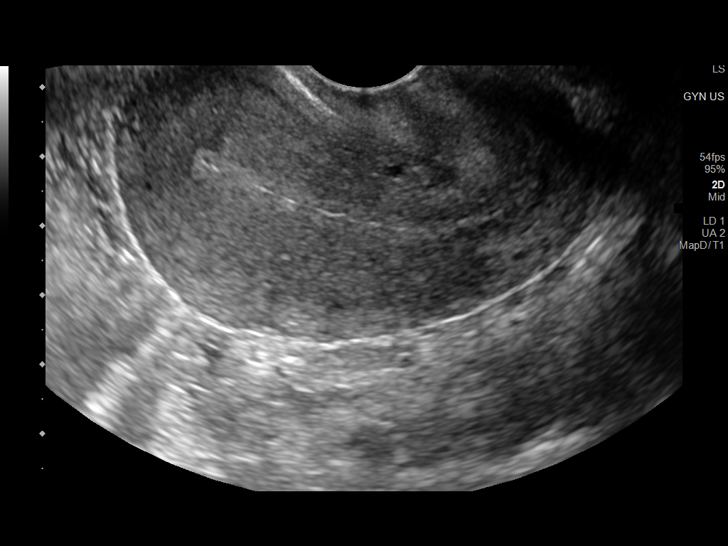

[13 of 25 positions shown; findings below may reference images not displayed]

FINDINGS: Uterus

Measurements: 5.6 x 3.8 x 4.9 cm = volume: 55 mL. No fibroids or
other mass visualized.

Endometrium

Thickness: 5 mm.  No focal abnormality visualized.

Right ovary

Measurements: 3.1 x 2.2 x 3 cm = volume: 10 mL. Normal appearance/no
adnexal mass.

Left ovary

Measurements: 3.9 x 2.2 x 3.4 cm = volume: 15 mL. Normal
appearance/no adnexal mass.

Pulsed Doppler evaluation of both ovaries demonstrates normal
low-resistance arterial and venous waveforms.

Other findings

No abnormal free fluid.
IMPRESSION: Unremarkable pelvic ultrasound.

## 2022-05-25 ENCOUNTER — Encounter (HOSPITAL_COMMUNITY): Payer: Self-pay

## 2022-05-25 ENCOUNTER — Emergency Department (HOSPITAL_COMMUNITY)
Admission: EM | Admit: 2022-05-25 | Discharge: 2022-05-26 | Disposition: A | Discharge status: Home / Self Care | Payer: Self-pay | Attending: Emergency Medicine | Admitting: Emergency Medicine

## 2022-05-25 ENCOUNTER — Other Ambulatory Visit: Payer: Self-pay

## 2022-05-25 DIAGNOSIS — K625 Hemorrhage of anus and rectum: Secondary | ICD-10-CM | POA: Insufficient documentation

## 2022-05-25 DIAGNOSIS — R109 Unspecified abdominal pain: Secondary | ICD-10-CM | POA: Insufficient documentation

## 2022-05-25 NOTE — ED Triage Notes (Signed)
States that she has been dealing with blood in her stool for the last year  States that her abdominal pain has gotten worse over the past couple of days

## 2022-05-26 LAB — CBC WITH DIFFERENTIAL/PLATELET
Abs Immature Granulocytes: 0.05 10*3/uL (ref 0.00–0.07)
Basophils Absolute: 0 10*3/uL (ref 0.0–0.1)
Basophils Relative: 1 %
Eosinophils Absolute: 0.2 10*3/uL (ref 0.0–0.5)
Eosinophils Relative: 2 %
HCT: 39.3 % (ref 36.0–46.0)
Hemoglobin: 12.1 g/dL (ref 12.0–15.0)
Immature Granulocytes: 1 %
Lymphocytes Relative: 42 %
Lymphs Abs: 3 10*3/uL (ref 0.7–4.0)
MCH: 26.8 pg (ref 26.0–34.0)
MCHC: 30.8 g/dL (ref 30.0–36.0)
MCV: 87.1 fL (ref 80.0–100.0)
Monocytes Absolute: 0.5 10*3/uL (ref 0.1–1.0)
Monocytes Relative: 7 %
Neutro Abs: 3.4 10*3/uL (ref 1.7–7.7)
Neutrophils Relative %: 47 %
Platelets: 238 10*3/uL (ref 150–400)
RBC: 4.51 MIL/uL (ref 3.87–5.11)
RDW: 12.8 % (ref 11.5–15.5)
WBC: 7.2 10*3/uL (ref 4.0–10.5)
nRBC: 0 % (ref 0.0–0.2)

## 2022-05-26 LAB — URINALYSIS, ROUTINE W REFLEX MICROSCOPIC
Bilirubin Urine: NEGATIVE
Glucose, UA: NEGATIVE mg/dL
Hgb urine dipstick: NEGATIVE
Ketones, ur: NEGATIVE mg/dL
Leukocytes,Ua: NEGATIVE
Nitrite: NEGATIVE
Protein, ur: NEGATIVE mg/dL
Specific Gravity, Urine: 1.029 (ref 1.005–1.030)
pH: 6 (ref 5.0–8.0)

## 2022-05-26 MED ORDER — HYDROCORTISONE (PERIANAL) 2.5 % EX CREA: 1.0000 | TOPICAL_CREAM | Freq: Two times a day (BID) | CUTANEOUS | 1 refills | Status: DC

## 2022-05-26 NOTE — Discharge Instructions (Signed)
Lets try to do the treatment plan that was initially prescribed by the gastroenterologist.  I have represcribed this for you.  I think you should give them a call and let them know that you are having ongoing bleeding.  They might consider doing a colonoscopy or might want to wait and see if this initial treatment works.  As we had said even though it is uncommon to have significant hemorrhoidal bleeding at your age; certainly if things get worse or if you feel lightheaded then please return to the emergency department for repeat evaluation.

## 2022-05-26 NOTE — ED Provider Notes (Signed)
[place] EMERGENCY DEPARTMENT AT [facility] Provider Note   CSN: [id number] Arrival date & time: 05/25/22  2313     History  Chief Complaint  Patient presents with   Abdominal Pain   Rectal Bleeding    Elaine Rodriguez is a 27 y.o. female.  27 yo F with a chief complaints of blood in her stool.  This been bright red.  Has been coming and going.  Going on for the past year.  She thought it was worse today than it had been so came to the ED for evaluation.  She denies feeling lightheaded or dizzy.  Denies any rectal pain.   Abdominal Pain Associated symptoms: hematochezia   Rectal Bleeding Associated symptoms: abdominal pain        Home Medications Prior to Admission medications   Medication Sig Start Date End Date Taking? Authorizing Provider  acetaminophen (TYLENOL) 500 MG tablet Take 500 mg by mouth every 6 (six) hours as needed. Patient not taking: Reported on 12/12/2021    [provider]  azelastine (ASTELIN) 0.1 % nasal spray Place 2 sprays into both nostrils 2 (two) times daily. Use in each nostril as directed 01/31/22   Larose Kells, MD  cetirizine (ZYRTEC) 10 MG tablet Take 1 tablet (10 mg total) by mouth daily as needed for allergies. 12/12/21   Larose Kells, MD  doxycycline (VIBRAMYCIN) 100 MG capsule Take 1 capsule (100 mg total) by mouth 2 (two) times daily. One po bid x 7 days Patient not taking: Reported on [id number]   Delora Fuel, MD  fluticasone [facility]) 50 MCG/ACT nasal spray Place 2 sprays into both nostrils daily. 12/12/21   Larose Kells, MD  hydrocortisone (ANUSOL-HC) 2.5 % rectal cream Place 1 Application rectally 2 (two) times daily. Place one application rectally twice daily for seven days. 05/26/22   Deno Etienne, DO  methocarbamol (ROBAXIN) 500 MG tablet Take 1 tablet (500 mg total) by mouth 2 (two) times daily. Patient not taking: Reported on 06/15/2020 06/13/20   Providence Lanius A, PA-C  metroNIDAZOLE (FLAGYL) 500 MG  tablet Take 1 tablet (500 mg total) by mouth 2 (two) times daily. Patient not taking: Reported on [id number]   Delora Fuel, MD  naproxen (NAPROSYN) 500 MG tablet Take 1 tablet (500 mg total) by mouth 2 (two) times daily. Patient not taking: Reported on [id number]   Delora Fuel, MD      Allergies    Patient has no known allergies.    Review of Systems   Review of Systems  Gastrointestinal:  Positive for abdominal pain and hematochezia.    Physical Exam Updated Vital Signs BP 120/78   Pulse 88   Temp 98.2 F (36.8 C) (Oral)   Resp 17   Ht '5\' 6"'$  (1.676 m)   Wt 90.7 kg   SpO2 96%   BMI 32.28 kg/m  Physical Exam Vitals and nursing note reviewed.  Constitutional:      General: She is not in acute distress.    Appearance: She is well-developed. She is not diaphoretic.  HENT:     Head: Normocephalic and atraumatic.  Eyes:     Pupils: Pupils are equal, round, and reactive to light.  Cardiovascular:     Rate and Rhythm: Normal rate and regular rhythm.     Heart sounds: No murmur heard.    No friction rub. No gallop.  Pulmonary:     Effort: Pulmonary effort is normal.  Breath sounds: No wheezing or rales.  Abdominal:     General: There is no distension.     Palpations: Abdomen is soft.     Tenderness: There is no abdominal tenderness.  Musculoskeletal:        General: No tenderness.     Cervical back: Normal range of motion and neck supple.  Skin:    General: Skin is warm and dry.  Neurological:     Mental Status: She is alert and oriented to person, place, and time.  Psychiatric:        Behavior: Behavior normal.     ED Results / Procedures / Treatments   Labs (all labs ordered are listed, but only abnormal results are displayed) Labs Reviewed  URINALYSIS, ROUTINE W REFLEX MICROSCOPIC - Abnormal; Notable for the following components:      Result Value   APPearance HAZY (*)    All other components within normal limits  CBC WITH  DIFFERENTIAL/PLATELET    EKG None  Radiology No results found.  Procedures Procedures    Medications Ordered in ED Medications - No data to display  ED Course/ Medical Decision Making/ A&P                             Medical Decision Making Risk Prescription drug management.   27 yo F with a chief complaints of bright red blood per rectum.  This been occurring off and on for at least a year.  She has seen GI at some point during this and I reviewed the patient's medical records and they have started her on the suppositories that she had declined to take.  Discussed with her about trying to take these.  Her hemoglobin here is normal.  Follow-up with GI in the office.  4:07 AM:  I have discussed the diagnosis/risks/treatment options with the patient.  Evaluation and diagnostic testing in the emergency department does not suggest an emergent condition requiring admission or immediate intervention beyond what has been performed at this time.  They will follow up with PCP. We also discussed returning to the ED immediately if new or worsening sx occur. We discussed the sx which are most concerning (e.g., sudden worsening pain, fever, inability to tolerate by mouth) that necessitate immediate return. Medications administered to the patient during their visit and any new prescriptions provided to the patient are listed below.  Medications given during this visit Medications - No data to display   The patient appears reasonably screen and/or stabilized for discharge and I doubt any other medical condition or other [facility] requiring further screening, evaluation, or treatment in the ED at this time prior to discharge.          Final Clinical Impression(s) / ED Diagnoses Final diagnoses:  Rectal bleeding    Rx / DC Orders ED Discharge Orders          Ordered    hydrocortisone (ANUSOL-HC) 2.5 % rectal cream  2 times daily        05/26/22 0115              Deno Etienne,  DO 05/26/22 0407

## 2022-06-05 ENCOUNTER — Ambulatory Visit: Payer: Self-pay | Admitting: Physician Assistant

## 2023-11-20 ENCOUNTER — Ambulatory Visit (HOSPITAL_COMMUNITY): Payer: Self-pay

## 2023-11-22 ENCOUNTER — Emergency Department (HOSPITAL_COMMUNITY)
Admission: EM | Admit: 2023-11-22 | Discharge: 2023-11-23 | Disposition: A | Discharge status: Home / Self Care | Attending: Emergency Medicine | Admitting: Emergency Medicine

## 2023-11-22 ENCOUNTER — Other Ambulatory Visit: Payer: Self-pay

## 2023-11-22 ENCOUNTER — Encounter (HOSPITAL_COMMUNITY): Payer: Self-pay

## 2023-11-22 ENCOUNTER — Emergency Department (HOSPITAL_COMMUNITY)

## 2023-11-22 DIAGNOSIS — R1031 Right lower quadrant pain: Secondary | ICD-10-CM | POA: Insufficient documentation

## 2023-11-22 LAB — COMPREHENSIVE METABOLIC PANEL WITH GFR
ALT: 12 U/L (ref 0–44)
AST: 16 U/L (ref 15–41)
Albumin: 4 g/dL (ref 3.5–5.0)
Alkaline Phosphatase: 33 U/L — ABNORMAL LOW (ref 38–126)
Anion gap: 11 (ref 5–15)
BUN: 11 mg/dL (ref 6–20)
CO2: 21 mmol/L — ABNORMAL LOW (ref 22–32)
Calcium: 9.6 mg/dL (ref 8.9–10.3)
Chloride: 107 mmol/L (ref 98–111)
Creatinine, Ser: 0.88 mg/dL (ref 0.44–1.00)
GFR, Estimated: >60 mL/min (ref >60)
Glucose, Bld: 98 mg/dL (ref 70–99)
Potassium: 3.9 mmol/L (ref 3.5–5.1)
Sodium: 139 mmol/L (ref 135–145)
Total Bilirubin: 0.4 mg/dL (ref 0.0–1.2)
Total Protein: 7.5 g/dL (ref 6.5–8.1)

## 2023-11-22 LAB — URINALYSIS, ROUTINE W REFLEX MICROSCOPIC
Bilirubin Urine: NEGATIVE
Glucose, UA: NEGATIVE mg/dL
Hgb urine dipstick: NEGATIVE
Ketones, ur: NEGATIVE mg/dL
Leukocytes,Ua: NEGATIVE
Nitrite: NEGATIVE
Protein, ur: NEGATIVE mg/dL
Specific Gravity, Urine: 1.027 (ref 1.005–1.030)
pH: 6 (ref 5.0–8.0)

## 2023-11-22 LAB — CBC
HCT: 39.1 % (ref 36.0–46.0)
Hemoglobin: 12.4 g/dL (ref 12.0–15.0)
MCH: 26.1 pg (ref 26.0–34.0)
MCHC: 31.7 g/dL (ref 30.0–36.0)
MCV: 82.3 fL (ref 80.0–100.0)
Platelets: 231 K/uL (ref 150–400)
RBC: 4.75 MIL/uL (ref 3.87–5.11)
RDW: 13.4 % (ref 11.5–15.5)
WBC: 5.5 K/uL (ref 4.0–10.5)
nRBC: 0 % (ref 0.0–0.2)

## 2023-11-22 LAB — HCG, SERUM, QUALITATIVE: Preg, Serum: NEGATIVE

## 2023-11-22 LAB — LIPASE, BLOOD: Lipase: 52 U/L — ABNORMAL HIGH (ref 11–51)

## 2023-11-22 MED ORDER — IOHEXOL 350 MG/ML SOLN
75.0000 mL | Freq: Once | INTRAVENOUS | Status: AC | PRN
  Administered 2023-11-22: 75 mL via INTRAVENOUS

## 2023-11-22 MED ORDER — DICYCLOMINE HCL 10 MG PO CAPS
10.0000 mg | ORAL_CAPSULE | Freq: Once | ORAL | Status: AC
  Administered 2023-11-22: 10 mg via ORAL
  Filled 2023-11-22: qty 1

## 2023-11-22 NOTE — ED Provider Notes (Signed)
 [place] EMERGENCY DEPARTMENT AT [facility] Provider Note   CSN: [id number] Arrival date & time: 11/22/23  1403     History Chief Complaint  Patient presents with   Abdominal Pain    HPI: Elaine Rodriguez is a 28 y.o. female with history pertinent elevated BMI on [place], hemorrhoids who presents complaining of right lower quadrant pain. Patient arrived via POV.  History provided by patient.  No interpreter required during this encounter.  Patient reports that she has had severe right lower quadrant pain persistently for approximately 1 month.  Reports that pain is constant, is aggravated by eating, however there are no specific alleviating factors.  Denies any fever, chills, nausea, vomiting, diarrhea, chest pain, shortness of breath, urgency, frequency, dysuria, hematuria, vaginal bleeding, vaginal discharge.  Reports LMP was approximately 2 weeks ago and pain was no different during her menstrual cycle than it is when she is not on her menstrual cycle.  Reports that she has not yet seen a physician for this complaint, however given persistent pain she wanted to come to the emergency department to have it evaluated.  Patient's recorded medical, surgical, social, medication list and allergies were reviewed in the Snapshot window as part of the initial history.   Prior to Admission medications   Medication Sig Start Date End Date Taking? Authorizing Provider  acetaminophen  (TYLENOL ) 500 MG tablet Take 500 mg by mouth every 6 (six) hours as needed. Patient not taking: Reported on 12/12/2021    [provider]  azelastine  (ASTELIN ) 0.1 % nasal spray Place 2 sprays into both nostrils 2 (two) times daily. Use in each nostril as directed 01/31/22   Tobie Arleta SQUIBB, MD  cetirizine  (ZYRTEC ) 10 MG tablet Take 1 tablet (10 mg total) by mouth daily as needed for allergies. 12/12/21   Tobie Arleta SQUIBB, MD  doxycycline  (VIBRAMYCIN ) 100 MG capsule Take 1 capsule (100 mg total) by mouth  2 (two) times daily. One po bid x 7 days Patient not taking: Reported on 12/12/2021 04/21/21   Raford Lenis, MD  fluticasone  (FLONASE ) 50 MCG/ACT nasal spray Place 2 sprays into both nostrils daily. 12/12/21   Tobie Arleta SQUIBB, MD  hydrocortisone  (ANUSOL -HC) 2.5 % rectal cream Place 1 Application rectally 2 (two) times daily. Place one application rectally twice daily for seven days. 05/26/22   Emil Share, DO  methocarbamol  (ROBAXIN ) 500 MG tablet Take 1 tablet (500 mg total) by mouth 2 (two) times daily. Patient not taking: Reported on 06/15/2020 06/13/20   Layden, Lindsey A, PA-C  metroNIDAZOLE  (FLAGYL ) 500 MG tablet Take 1 tablet (500 mg total) by mouth 2 (two) times daily. Patient not taking: Reported on 12/12/2021 04/21/21   Raford Lenis, MD  naproxen  (NAPROSYN ) 500 MG tablet Take 1 tablet (500 mg total) by mouth 2 (two) times daily. Patient not taking: Reported on 12/12/2021 04/21/21   Raford Lenis, MD     Allergies: Patient has no known allergies.   Review of Systems   ROS as per HPI  Physical Exam Updated Vital Signs BP (!) 109/55   Pulse 81   Temp 97.9 F (36.6 C) (Oral)   Resp 16   Ht 5' 6 (1.676 m)   Wt 95.7 kg   LMP 10/30/2023 (Exact Date)   SpO2 100%   BMI 34.06 kg/m  Physical Exam Vitals and nursing note reviewed.  Constitutional:      General: She is not in acute distress.    Appearance: She is well-developed.  HENT:  Head: Normocephalic and atraumatic.  Eyes:     Conjunctiva/sclera: Conjunctivae normal.  Cardiovascular:     Rate and Rhythm: Normal rate and regular rhythm.     Heart sounds: No murmur heard. Pulmonary:     Effort: Pulmonary effort is normal. No respiratory distress.     Breath sounds: Normal breath sounds.  Abdominal:     Palpations: Abdomen is soft.     Tenderness: There is abdominal tenderness in the right lower quadrant. There is guarding. There is no rebound. Positive signs include McBurney's sign.  Musculoskeletal:        General: No swelling.      Cervical back: Neck supple.  Skin:    General: Skin is warm and dry.     Capillary Refill: Capillary refill takes less than 2 seconds.  Neurological:     Mental Status: She is alert.  Psychiatric:        Mood and Affect: Mood normal.     ED Course/ Medical Decision Making/ A&P    Procedures Procedures   Medications Ordered in ED Medications  dicyclomine  (BENTYL ) capsule 10 mg (10 mg Oral Given 11/22/23 2304)  iohexol  (OMNIPAQUE ) 350 MG/ML injection 75 mL (75 mLs Intravenous Contrast Given 11/22/23 2323)    Medical Decision Making:   Elaine Rodriguez is a 28 y.o. female who presents for right lower quadrant pain as per above.  Physical exam is pertinent for right lower quadrant tenderness to palpation with focal guarding.   The differential includes but is not limited to appendicitis, ovarian cyst, ovarian torsion, panniculitis, terminal ileitis, obstruction, UTI, nephrolithiasis, cholecystitis, biliary colic.  Independent historian: None  External data reviewed: No pertinent external data  Labs: Ordered, Independent interpretation, and Details: CBC without leukocytosis, anemia, thrombocytopenia.  Lipase mildly elevated at 52.  UA without UTI or hematuria.  CMP without AKI, emergent electrolyte derangement, emergent LFT abnormality  Radiology: Ordered, Independent interpretation, Details: Reviewed CT of the abdomen and pelvis, do not appreciate free air.  I do appreciate small amount of free fluid in the pelvis.  No focal fluid collections, hyperenhancement, fat stranding, obstructive bowel gas pattern, and All images reviewed independently.  Agree with radiology report at this time.   CT ABDOMEN PELVIS W CONTRAST Result Date: 11/22/2023 EXAM: CT ABDOMEN AND PELVIS WITH CONTRAST 11/22/2023 11:23:14 PM TECHNIQUE: CT of the abdomen and pelvis was performed with the administration of intravenous contrast. Multiplanar reformatted images are provided for review. Automated exposure  control, iterative reconstruction, and/or weight-based adjustment of the mA/kV was utilized to reduce the radiation dose to as low as reasonably achievable. COMPARISON: None available. CLINICAL HISTORY: RLQ abdominal pain; focal RLQ TTP, ongoing for 1 month. FINDINGS: LOWER CHEST: No acute abnormality. LIVER: The liver is unremarkable. GALLBLADDER AND BILE DUCTS: Gallbladder is unremarkable. No biliary ductal dilatation. SPLEEN: No acute abnormality. PANCREAS: No acute abnormality. ADRENAL GLANDS: No acute abnormality. KIDNEYS, URETERS AND BLADDER: No stones in the kidneys or ureters. No hydronephrosis. No perinephric or periureteral stranding. Urinary bladder is unremarkable. GI AND BOWEL: Stomach demonstrates no acute abnormality. There is no bowel obstruction. Normal appendix. PERITONEUM AND RETROPERITONEUM: Small volume low density free fluid in the pelvis is favored to be physiologic. No free air. VASCULATURE: Aorta is normal in caliber. LYMPH NODES: No lymphadenopathy. REPRODUCTIVE ORGANS: No acute abnormality. BONES AND SOFT TISSUES: No acute osseous abnormality. No focal soft tissue abnormality. IMPRESSION: 1. No acute findings in the abdomen or pelvis. Normal appendix . Electronically signed by: Norman Gatlin MD 11/22/2023  11:28 PM EDT RP Workstation: [id number]    EKG/Medicine tests: Not indicated EKG Interpretation:    Interventions: Bentyl    See the EMR for full details regarding lab and imaging results.  Patient presents for 1 month of abdominal pain, severe, though not waxing and waning.  Patient has not had other associated symptoms.  Screening labs are obtained in triage and are overall reassuring, there is mild elevation of lipase, however patient was without left upper quadrant pain or tenderness, and she has not had persistent vomiting, therefore pancreatitis overall less likely.  Other acute intra-abdominal processes are less likely given duration of patient's symptoms, however given  patient does have focal tenderness with guarding, do feel that patient warrants CT of the abdomen pelvis.  Given time course is less consistent with acute intra-abdominal process, patient initially trialed on Bentyl  for pain control.  Patient overall with minimal relief after Bentyl , however CT obtained and is reassuring.  No evidence of pancreatitis despite mildly elevated lipase.  Given patient vitally stable, chronicity of symptoms, reassuring CT scan, reassuring labs.  Discussed with patient, overall unclear etiology of symptoms, discussed this with patient, however also discussed that emergent pathology is unlikely, and patient expressed understanding.  Will discharge with outpatient referral to gastroenterology, patient is agreeable to this plan.  Presentation is most consistent with acute complicated illness  Discussion of management or test interpretations with external provider(s): Not indicated  Risk Drugs:Prescription drug management  Disposition: DISCHARGE: I believe that the patient is safe for discharge home with outpatient follow-up. Patient was informed of all pertinent physical exam, laboratory, and imaging findings. Patient's suspected etiology of their symptom presentation was discussed with the patient and all questions were answered. We discussed following up with PCP. I provided thorough ED return precautions. The patient feels safe and comfortable with this plan.  MDM generated using voice dictation software and may contain dictation errors.  Please contact me for any clarification or with any questions.  Clinical Impression:  1. Right lower quadrant pain      Discharge   Final Clinical Impression(s) / ED Diagnoses Final diagnoses:  Right lower quadrant pain    Rx / DC Orders ED Discharge Orders          Ordered    Ambulatory referral to Gastroenterology        11/23/23 0004             Rogelia Jerilynn RAMAN, MD 11/23/23 [phone]

## 2023-11-22 NOTE — ED Triage Notes (Signed)
 C/o abd. Pain onset 1 month ago , states it hurts worse on right sdie no fever , denies n/v/d. LMP 08/19

## 2023-11-23 NOTE — Discharge Instructions (Signed)
 Elaine Rodriguez  Thank you for allowing us  to take care of you today.  You came to the Emergency Department today because.  Here in the emergency department we did labs and a CT which did not show the cause of your pain.  Your pancreas number was mildly elevated, however we are not seeing any signs of pancreas inflammation on your CT scan, and your symptoms are not typical of what we would see with pancreatitis (vomiting and pain in the left upper part of your belly).  Your urine did not show any signs of urinary tract infection, your pregnancy test was negative, your kidney, liver numbers were normal, and your blood counts were normal.  Your CT does not show any abnormalities.  It is unclear what is causing your symptoms, however your labs and imaging are reassuring against a emergency cause such as severe inflammation or infection of one of the organs in your abdomen.  We recommend Tylenol  and ibuprofen as needed for pain control, and we will give you a referral to follow-up with a gastroenterologist, who is better equipped to evaluate for other types of abdominal pain that are more chronic in nature.   To-Do: 1. Please follow-up with your primary doctor within 1 to 2 weeks / as soon as possible.   Please return to the Emergency Department or call 911 if you experience have worsening of your symptoms, or do not get better, chest pain, shortness of breath, severe or significantly worsening pain, high fever, severe confusion, pass out or have any reason to think that you need emergency medical care.   We hope you feel better soon.   Mitzie Later, MD Department of Emergency Medicine [facility]

## 2023-11-26 ENCOUNTER — Encounter: Payer: Self-pay | Admitting: Physician Assistant

## 2024-01-08 ENCOUNTER — Institutional Professional Consult (permissible substitution): Admitting: Student

## 2024-01-16 NOTE — Progress Notes (Addendum)
 01/20/2024 Elaine Rodriguez [id number] February 03, 1996  Referring provider: Adella Norris, MD Primary GI doctor: Dr. Suzann  ASSESSMENT AND PLAN:  Rectal bleeding with history of internal hemorrhoids 2022 Paternal uncle with colon cancer, mother with possible advanced precancerous polyp, no IBD Hemorrhoid suppositories did not help BRB blood on TP, in stool, and toilet, a few times a month and increasing in frequency Denies rectal pain, unintentional weight loss 11/22/2023  HGB 12.4 MCV 82.3 Platelets 231  Constipation Uses stool softener, some straining - Increase fiber/ water intake, decrease caffeine, increase activity level. -Will add on Miralax daily  Right lower quadrant pain 11/22/2023 CTAP W no acute findings, normal appendix Denies GERD, nausea, vomiting, fever, chills Has intermittent RLQ AB pain, not associated food, not improved after a BM Likely constipation  I have reviewed the clinic note as outlined by Elaine Rodriguez and agree with the assessment, plan and medical decision making.  Elaine Rodriguez presents to the office today for evaluation of rectal bleeding with known history of hemorrhoids, constipation, right lower quadrant abdominal pain.  Reports bright red blood on tissue paper after wiping and in the toilet bowl a few times a month with increasing frequency.  Prior endoscopy confirmed the presence of internal hemorrhoids.  Recent CTAP did not show any significant abnormalities.  I agree with conservative management of hemorrhoids and constipation.  If symptoms are not improving can reevaluate the possibility of performing a fecal calprotectin or endoscopic evaluation to rule out mild proctitis which may not be evident on CT scan.  Elaine Suzann, MD   Patient Care Team: Elaine Norris, MD as PCP - General (Internal Medicine)  HISTORY OF PRESENT ILLNESS: 28 y.o. female with a past medical history listed below presents for evaluation of rectal  bleeding.   Last seen in the office 06/15/2020 by Elaine Failing, Rodriguez for rectal bleeding, treated for internal hemorrhoids seen on anoscopy.  Discussed the use of AI scribe software for clinical note transcription with the patient, who gave verbal consent to proceed.  History of Present Illness   Elaine Rodriguez is a 28 year old female who presents with persistent rectal bleeding. She was initially evaluated by Elaine, a healthcare provider, for this issue.  She has experienced rectal bleeding since 2022, initially evaluated by a healthcare provider who performed an endoscopy revealing internal hemorrhoids. Despite treatment with a cream, the bleeding persists and occurs randomly, not associated with bowel movements. The bleeding is bright red and appears on toilet paper and in the toilet bowl, occurring a couple of times a month despite the use of stool softeners. No rectal pain or discomfort, weight loss related to this issue, nausea, vomiting, heartburn, fevers, chills, urinary issues, or burning and itching. She has bowel movements at least once a day and uses stool softeners as needed. She also uses Miralax, which takes a few days to be effective.  In September, she had a CT scan for right lower quadrant abdominal pain. The abdominal discomfort has been intermittent and is not currently present. No associated symptoms such as nausea or vomiting.  Family history is significant for colon cancer on her father's side, though she is uncertain of the specific relative. Her mother has experienced similar symptoms and had a precancerous condition identified. There is no known family history of inflammatory bowel disease such as Crohn's disease or ulcerative colitis.      She  reports that she has quit smoking. Her smoking use included cigarettes. She has a 1.8  pack-year smoking history. She has never been exposed to tobacco smoke. She has never used smokeless tobacco. She reports current alcohol  use. She reports that she does not use drugs.  RELEVANT GI HISTORY, IMAGING AND LABS: Results   RADIOLOGY CT scan abdomen: Normal appendix, no inflammation (11/22/2023)  DIAGNOSTIC Endoscopy: Internal hemorrhoids (2022)      CBC    Component Value Date/Time   WBC 5.5 11/22/2023 1420   RBC 4.75 11/22/2023 1420   HGB 12.4 11/22/2023 1420   HCT 39.1 11/22/2023 1420   PLT 231 11/22/2023 1420   MCV 82.3 11/22/2023 1420   MCH 26.1 11/22/2023 1420   MCHC 31.7 11/22/2023 1420   RDW 13.4 11/22/2023 1420   LYMPHSABS 3.0 05/25/2022 2352   MONOABS 0.5 05/25/2022 2352   EOSABS 0.2 05/25/2022 2352   BASOSABS 0.0 05/25/2022 2352   Recent Labs    11/22/23 1420  HGB 12.4    CMP     Component Value Date/Time   NA 139 11/22/2023 1420   K 3.9 11/22/2023 1420   CL 107 11/22/2023 1420   CO2 21 (L) 11/22/2023 1420   GLUCOSE 98 11/22/2023 1420   BUN 11 11/22/2023 1420   CREATININE 0.88 11/22/2023 1420   CALCIUM 9.6 11/22/2023 1420   PROT 7.5 11/22/2023 1420   ALBUMIN 4.0 11/22/2023 1420   AST 16 11/22/2023 1420   ALT 12 11/22/2023 1420   ALKPHOS 33 (L) 11/22/2023 1420   BILITOT 0.4 11/22/2023 1420   GFRNONAA >60 11/22/2023 1420   GFRAA >60 11/05/2017 0958      Latest Ref Rng & Units 11/22/2023    2:20 PM 04/20/2021    5:38 PM  Hepatic Function  Total Protein 6.5 - 8.1 g/dL 7.5  7.0   Albumin 3.5 - 5.0 g/dL 4.0  4.1   AST 15 - 41 U/L 16  19   ALT 0 - 44 U/L 12  14   Alk Phosphatase 38 - 126 U/L 33  35   Total Bilirubin 0.0 - 1.2 mg/dL 0.4  0.2       Current Medications:        Current Outpatient Medications (Other):    hydrocortisone  (ANUSOL -HC) 2.5 % rectal cream, Place 1 Application rectally 2 (two) times daily.   hydrocortisone  (ANUSOL -HC) 25 MG suppository, Place 1 suppository (25 mg total) rectally 2 (two) times daily.   Na Sulfate-K Sulfate-Mg Sulfate concentrate (SUPREP) 17.5-3.13-1.6 GM/177ML SOLN, Take 1 kit (354 mLs total) by mouth once for 1  dose.  Medical History:  Past Medical History:  Diagnosis Date   Prolapsed internal hemorrhoids, grade 2    Allergies: No Known Allergies   Surgical History:  She  has no past surgical history on file. Family History:  Her family history includes Asthma in her sister; Breast cancer in her mother; Cancer in her mother and paternal uncle; Cervical cancer in her paternal aunt; Colon cancer in her paternal aunt; Diabetes in her mother; Eczema in her sister; Hypertension in her father and mother. She was adopted.  REVIEW OF SYSTEMS  : All other systems reviewed and negative except where noted in the History of Present Illness.  PHYSICAL EXAM: BP 110/70   Pulse 83   Ht 5' 6 (1.676 m)   Wt 206 lb 2 oz (93.5 kg)   BMI 33.27 kg/m  Physical Exam   GENERAL APPEARANCE: Well nourished, in no apparent distress. HEENT: No cervical lymphadenopathy, unremarkable thyroid, sclerae anicteric, conjunctiva pink. RESPIRATORY: Respiratory effort normal,  breath sounds clear to auscultation bilaterally without rales, rhonchi, or wheezing. CARDIO: RRR with no MRGs, peripheral pulses intact. ABDOMEN: Soft, non distended, active bowel sounds in all 4 quadrants, no tenderness to palpation, no rebound, no mass appreciated. RECTAL: No fissures or hemorrhoids, no masses or abnormalities palpated in rectum, no blood observed on stool or finger. MUSCULOSKELETAL: Full ROM, normal gait, without edema. SKIN: Dry, intact without rashes or lesions. No jaundice. NEURO: Alert, oriented, no focal deficits. PSYCH: Cooperative, normal mood and affect. NECK: Thyroid examined, no abnormalities noted.      Elaine JONELLE Coombs, Rodriguez-C 11:41 AM

## 2024-01-20 ENCOUNTER — Ambulatory Visit: Payer: Self-pay | Admitting: Physician Assistant

## 2024-01-20 ENCOUNTER — Ambulatory Visit (INDEPENDENT_AMBULATORY_CARE_PROVIDER_SITE_OTHER): Admitting: Physician Assistant

## 2024-01-20 ENCOUNTER — Encounter: Payer: Self-pay | Admitting: Physician Assistant

## 2024-01-20 ENCOUNTER — Other Ambulatory Visit

## 2024-01-20 VITALS — BP 110/70 | HR 83 | Ht 66.0 in | Wt 206.1 lb

## 2024-01-20 DIAGNOSIS — K625 Hemorrhage of anus and rectum: Secondary | ICD-10-CM | POA: Diagnosis not present

## 2024-01-20 DIAGNOSIS — K5904 Chronic idiopathic constipation: Secondary | ICD-10-CM

## 2024-01-20 DIAGNOSIS — R1032 Left lower quadrant pain: Secondary | ICD-10-CM

## 2024-01-20 LAB — SEDIMENTATION RATE: Sed Rate: 8 mm/h (ref 0–20)

## 2024-01-20 LAB — C-REACTIVE PROTEIN: CRP: 0.6 mg/dL (ref 0.5–20.0)

## 2024-01-20 MED ORDER — HYDROCORTISONE (PERIANAL) 2.5 % EX CREA: 1.0000 | TOPICAL_CREAM | Freq: Two times a day (BID) | CUTANEOUS | 2 refills | Status: DC

## 2024-01-20 MED ORDER — HYDROCORTISONE ACETATE 25 MG RE SUPP: 25.0000 mg | Freq: Two times a day (BID) | RECTAL | 0 refills | Status: DC

## 2024-01-20 MED ORDER — NA SULFATE-K SULFATE-MG SULF 17.5-3.13-1.6 GM/177ML PO SOLN: 1.0000 | Freq: Once | ORAL | 0 refills | Status: AC

## 2024-01-20 NOTE — Patient Instructions (Addendum)
 Your provider has requested that you go to the basement level for lab work before leaving today. Press B on the elevator. The lab is located at the first door on the left as you exit the elevator.  Toileting tips to help with your constipation - Drink at least 64-80 ounces of water/liquid per day. - Establish a time to try to move your bowels every day.  For many people, this is after a cup of coffee or after a meal such as breakfast. - Sit all of the way back on the toilet keeping your back fairly straight and while sitting up, try to rest the tops of your forearms on your upper thighs.   - Raising your feet with a step stool/squatty potty can be helpful to improve the angle that allows your stool to pass through the rectum. - Relax the rectum feeling it bulge toward the toilet water.  If you feel your rectum raising toward your body, you are contracting rather than relaxing. - Breathe in and slowly exhale. Belly breath by expanding your belly towards your belly button. Keep belly expanded as you gently direct pressure down and back to the anus.  A low pitched GRRR sound can assist with increasing intra-abdominal pressure.  (Can also trying to blow on a pinwheel and make it move, this helps with the same belly breathing) - Repeat 3-4 times. If unsuccessful, contract the pelvic floor to restore normal tone and get off the toilet.  Avoid excessive straining. - To reduce excessive wiping by teaching your anus to normally contract, place hands on outer aspect of knees and resist knee movement outward.  Hold 5-10 second then place hands just inside of knees and resist inward movement of knees.  Hold 5 seconds.  Repeat a few times each way.  Go to the ER if unable to pass gas, severe AB pain, unable to hold down food, any shortness of breath of chest pain.  Miralax is an osmotic laxative.  It only brings more water into the stool.  This is safe to take daily.  Can take up to 17 gram of miralax twice  a day.  Mix with juice or coffee.  Start 1 capful at for 3-4 days and reassess your response in 3-4 days.  You can increase and decrease the dose based on your response.  Remember, it can take up to 3-4 days to take effect OR for the effects to wear off.   I often pair this with benefiber in the morning to help assure the stool is not too loose.   Please do sitz baths- these can be found at the pharmacy. It is a chief operating officer that is put in your toliet.  Please increase fiber or add benefiber, increase water and increase acitivity.  Will send in hydrocoritsone suppository, cheapest with GOODRX from [facility], [facility], [employer] or [facility] if your insurance does not pay for it. If the hemorrhoid suppository sent in is too expensive you can do this over the counter trick.  Apply a pea size amount of generic prescription Anusol  HC cream that has been sent into your pharmacy to the tip of an over the counter PrepH suppository and insert rectally once every night for at least 7 nights.  If this does not improve there are procedures that can be done.   About Hemorrhoids  Hemorrhoids are swollen veins in the lower rectum and anus.  Also called piles, hemorrhoids are a common problem.  Hemorrhoids may be internal (inside  the rectum) or external (around the anus).  Internal Hemorrhoids  Internal hemorrhoids are often painless, but they rarely cause bleeding.  The internal veins may stretch and fall down (prolapse) through the anus to the outside of the body.  The veins may then become irritated and painful.  External Hemorrhoids  External hemorrhoids can be easily seen or felt around the anal opening.  They are under the skin around the anus.  When the swollen veins are scratched or broken by straining, rubbing or wiping they sometimes bleed.  How Hemorrhoids Occur  Veins in the rectum and around the anus tend to swell under pressure.  Hemorrhoids can result from increased pressure in the  veins of your anus or rectum.  Some sources of pressure are:  Straining to have a bowel movement because of constipation Waiting too long to have a bowel movement Coughing and sneezing often Sitting for extended periods of time, including on the toilet Diarrhea Obesity Trauma or injury to the anus Some liver diseases Stress Family history of hemorrhoids Pregnancy  Pregnant women should try to avoid becoming constipated, because they are more likely to have hemorrhoids during pregnancy.  In the last trimester of pregnancy, the enlarged uterus may press on blood vessels and causes hemorrhoids.  In addition, the strain of childbirth sometimes causes hemorrhoids after the birth.  Symptoms of Hemorrhoids  Some symptoms of hemorrhoids include: Swelling and/or a tender lump around the anus Itching, mild burning and bleeding around the anus Painful bowel movements with or without constipation Bright red blood covering the stool, on toilet paper or in the toilet bowel.   Symptoms usually go away within a few days.  Always talk to your doctor about any bleeding to make sure it is not from some other causes.  Diagnosing and Treating Hemorrhoids  Diagnosis is made by an examination by your healthcare provider.  Special test can be performed by your doctor.    Most cases of hemorrhoids can be treated with: High-fiber diet: Eat more high-fiber foods, which help prevent constipation.  Ask for more detailed fiber information on types and sources of fiber from your healthcare provider. Fluids: Drink plenty of water.  This helps soften bowel movements so they are easier to pass. Sitz baths and cold packs: Sitting in lukewarm water two or three times a day for 15 minutes cleases the anal area and may relieve discomfort.  If the water is too hot, swelling around the anus will get worse.  Placing a cloth-covered ice pack on the anus for ten minutes four times a day can also help reduce selling.  Gently  pushing a prolapsed hemorrhoid back inside after the bath or ice pack can be helpful. Medications: For mild discomfort, your healthcare provider may suggest over-the-counter pain medication or prescribe a cream or ointment for topical use.  The cream may contain witch hazel, zinc  oxide or petroleum jelly.  Medicated suppositories are also a treatment option.  Always consult your doctor before applying medications or creams. Procedures and surgeries: There are also a number of procedures and surgeries to shrink or remove hemorrhoids in more serious cases.  Talk to your physician about these options.  You can often prevent hemorrhoids or keep them from becoming worse by maintaining a healthy lifestyle.  Eat a fiber-rich diet of fruits, vegetables and whole grains.  Also, drink plenty of water and exercise regularly.   2007, [employer] Doc.30  You have been scheduled for a colonoscopy. Please follow written instructions  given to you at your visit today.   If you use inhalers (even only as needed), please bring them with you on the day of your procedure.  DO NOT TAKE 7 DAYS PRIOR TO TEST- Trulicity (dulaglutide) Ozempic, Wegovy (semaglutide) Mounjaro (tirzepatide) Bydureon Bcise (exanatide extended release)  DO NOT TAKE 1 DAY PRIOR TO YOUR TEST Rybelsus (semaglutide) Adlyxin (lixisenatide) Victoza (liraglutide) Byetta (exanatide) ___________________________________________________________________________  Due to recent changes in healthcare laws, you may see the results of your imaging and laboratory studies on MyChart before your provider has had a chance to review them.  We understand that in some cases there may be results that are confusing or concerning to you. Not all laboratory results come back in the same time frame and the provider may be waiting for multiple results in order to interpret others.  Please give us  48 hours in order for your provider to thoroughly review  all the results before contacting the office for clarification of your results.   _______________________________________________________  If your blood pressure at your visit was 140/90 or greater, please contact your primary care physician to follow up on this.  _______________________________________________________  If you are age 70 or older, your body mass index should be between 23-30. Your Body mass index is 33.27 kg/m. If this is out of the aforementioned range listed, please consider follow up with your Primary Care Provider.  If you are age 68 or younger, your body mass index should be between 19-25. Your Body mass index is 33.27 kg/m. If this is out of the aformentioned range listed, please consider follow up with your Primary Care Provider.   ________________________________________________________  The [facility] providers would like to encourage you to use MYCHART to communicate with providers for non-urgent requests or questions.  Due to long hold times on the telephone, sending your provider a message by [facility] may be a faster and more efficient way to get a response.  Please allow 48 business hours for a response.  Please remember that this is for non-urgent requests.  _______________________________________________________  [facility] is using a team-based approach to care.  Your team is made up of your doctor and two to three APPS. Our APPS (Nurse Practitioners and Physician Assistants) work with your physician to ensure care continuity for you. They are fully qualified to address your health concerns and develop a treatment plan. They communicate directly with your gastroenterologist to care for you. Seeing the Advanced Practice Practitioners on your physician's team can help you by facilitating care more promptly, often allowing for earlier appointments, access to diagnostic testing, procedures, and other specialty referrals.

## 2024-01-21 LAB — IGA: Immunoglobulin A: 119 mg/dL (ref 47–310)

## 2024-01-21 LAB — TISSUE TRANSGLUTAMINASE, IGA: (tTG) Ab, IgA: <1 U/mL

## 2024-01-23 ENCOUNTER — Other Ambulatory Visit: Payer: Self-pay | Admitting: Medical Genetics

## 2024-01-28 ENCOUNTER — Other Ambulatory Visit (HOSPITAL_COMMUNITY)
Admission: RE | Admit: 2024-01-28 | Discharge: 2024-01-28 | Disposition: A | Discharge status: Home / Self Care | Payer: Self-pay | Source: Ambulatory Visit | Attending: Medical Genetics | Admitting: Medical Genetics

## 2024-01-28 NOTE — Progress Notes (Unsigned)
 [facility] History and Physical   Primary Care Physician:  Adella Norris, MD   Reason for Procedure:  Rectal bleeding, constipation, right lower quadrant abdominal pain, family history of colon cancer and colon polyps  Plan:    Colonoscopy   The patient was provided an opportunity to ask questions and all were answered. The patient agreed with the plan.   HPI: Elaine Rodriguez is a 28 y.o. female undergoing colonoscopy for investigation of rectal bleeding, constipation, right lower quadrant abdominal pain and family history of colon cancer and colon polyps.  Patient has had intermittent rectal bleeding for at least the last 3 years.  Recently seen in the office reporting bright red blood on tissue paper after wiping and in the toilet bowl a few times a month with increasing frequency. Prior anoscopy confirmed the presence of internal hemorrhoids. Recent CTAP did not show any significant abnormalities.   Reports a family history of colorectal cancer in a paternal uncle and mother having colon polyps.   Past Medical History:  Diagnosis Date   Prolapsed internal hemorrhoids, grade 2     No past surgical history on file.  Prior to Admission medications   Medication Sig Start Date End Date Taking? Authorizing Provider  hydrocortisone  (ANUSOL -HC) 2.5 % rectal cream Place 1 Application rectally 2 (two) times daily. 01/20/24   Craig Alan SAUNDERS, PA-C  hydrocortisone  (ANUSOL -HC) 25 MG suppository Place 1 suppository (25 mg total) rectally 2 (two) times daily. 01/20/24   Craig Alan SAUNDERS, PA-C    Current Outpatient Medications  Medication Sig Dispense Refill   hydrocortisone  (ANUSOL -HC) 2.5 % rectal cream Place 1 Application rectally 2 (two) times daily. 30 g 2   hydrocortisone  (ANUSOL -HC) 25 MG suppository Place 1 suppository (25 mg total) rectally 2 (two) times daily. 12 suppository 0   No current facility-administered medications for this visit.    Allergies as of  01/30/2024   (No Known Allergies)    Family History  Adopted: Yes  Problem Relation Age of Onset   Hypertension Mother    Diabetes Mother    Cancer Mother        Stage 4 breast cancer.   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     They are engaged.   [employer], a dog, 2 birds, and 2 geckos.   Social Drivers of Corporate Investment Banker Strain: Low Risk  (04/15/2019)   Overall Financial Resource Strain (CARDIA)    Difficulty of Paying Living Expenses: Not very hard  Food Insecurity: Low Risk  (07/03/2023)   Received from [facility]   Hunger Vital Sign    Within the past 12 months, you worried that your food would run out before you got money to buy more: Never true    Within the past 12 months, the food you bought just didn't last and you didn't have money to get more. : Never true  Transportation Needs: No Transportation Needs  (07/03/2023)   Received from [facility]    In the past 12 months, has lack of reliable transportation kept you from medical appointments, meetings, work or from getting things needed for daily living? : No  Physical Activity: Not on file  Stress: Not on file  Social Connections: Not on file  Intimate Partner Violence: Not At Risk (04/15/2019)   Humiliation, Afraid, Rape, and Kick questionnaire    Fear of Current or Ex-Partner: No    Emotionally Abused: No    Physically Abused: No    Sexually Abused: No    Review of Systems:  All other review of systems negative except as mentioned in the HPI.  Physical Exam: Vital signs There were no vitals taken for this visit.  General:   Alert,  Well-developed, well-nourished, pleasant and cooperative in NAD Airway:  Mallampati  Lungs:  Clear throughout to auscultation.   Heart:  Regular rate and rhythm; no murmurs, clicks, rubs,  or gallops. Abdomen:  Soft, nontender and nondistended. Normal bowel sounds.   Neuro/Psych:  Normal mood and affect. A and O x 3  Inocente Hausen, MD [facility]

## 2024-01-30 ENCOUNTER — Ambulatory Visit: Admitting: Pediatrics

## 2024-01-30 ENCOUNTER — Encounter: Payer: Self-pay | Admitting: Pediatrics

## 2024-01-30 VITALS — BP 114/60 | HR 64 | Temp 97.3°F | Resp 22 | Ht 66.0 in | Wt 206.0 lb

## 2024-01-30 DIAGNOSIS — K6389 Other specified diseases of intestine: Secondary | ICD-10-CM | POA: Diagnosis not present

## 2024-01-30 DIAGNOSIS — Z800 Family history of malignant neoplasm of digestive organs: Secondary | ICD-10-CM

## 2024-01-30 DIAGNOSIS — D124 Benign neoplasm of descending colon: Secondary | ICD-10-CM

## 2024-01-30 DIAGNOSIS — R1032 Left lower quadrant pain: Secondary | ICD-10-CM

## 2024-01-30 DIAGNOSIS — K648 Other hemorrhoids: Secondary | ICD-10-CM | POA: Diagnosis not present

## 2024-01-30 DIAGNOSIS — Z83719 Family history of colon polyps, unspecified: Secondary | ICD-10-CM

## 2024-01-30 DIAGNOSIS — K635 Polyp of colon: Secondary | ICD-10-CM | POA: Diagnosis not present

## 2024-01-30 DIAGNOSIS — K625 Hemorrhage of anus and rectum: Secondary | ICD-10-CM

## 2024-01-30 MED ORDER — HYDROCORTISONE ACETATE 25 MG RE SUPP: 25.0000 mg | Freq: Two times a day (BID) | RECTAL | 1 refills | Status: DC

## 2024-01-30 MED ORDER — SODIUM CHLORIDE 0.9 % IV SOLN: 500.0000 mL | Freq: Once | INTRAVENOUS | Status: DC

## 2024-01-30 MED ORDER — HYDROCORTISONE ACETATE 25 MG RE SUPP: 25.0000 mg | Freq: Two times a day (BID) | RECTAL | 0 refills | Status: DC

## 2024-01-30 NOTE — Progress Notes (Signed)
 Sedate, gd SR, tolerated procedure well, VSS, report to RN

## 2024-01-30 NOTE — Op Note (Signed)
 [facility] Patient Name: Elaine Rodriguez Procedure Date: 01/30/2024 12:17 PM MRN: [id number] Endoscopist: Inocente Hausen , MD, [id number] Age: 28 Referring MD:  Date of Birth: June 18, 1995 Gender: Female Account #: [id number] Procedure:                Colonoscopy Indications:              This is the patient's first colonoscopy, Rectal                            bleeding, Family history of colon cancer in a                            distant relative, Family history of colonic polyps                            in a first-degree relative Medicines:                Monitored Anesthesia Care Procedure:                Pre-Anesthesia Assessment:                           - Prior to the procedure, a History and Physical                            was performed, and patient medications and                            allergies were reviewed. The patient's tolerance of                            previous anesthesia was also reviewed. The risks                            and benefits of the procedure and the sedation                            options and risks were discussed with the patient.                            All questions were answered, and informed consent                            was obtained. Prior Anticoagulants: The patient has                            taken no anticoagulant or antiplatelet agents. ASA                            Grade Assessment: II - A patient with mild systemic                            disease. After reviewing the risks and benefits,  the patient was deemed in satisfactory condition to                            undergo the procedure.                           After obtaining informed consent, the colonoscope                            was passed under direct vision. Throughout the                            procedure, the patient's blood pressure, pulse, and                            oxygen saturations were monitored  continuously. The                            Olympus Scope [id number] was introduced through the                            anus and advanced to the cecum, identified by                            appendiceal orifice and ileocecal valve. The                            colonoscopy was performed without difficulty. The                            patient tolerated the procedure well. The quality                            of the bowel preparation was good. The ileocecal                            valve, appendiceal orifice, and rectum were                            photographed. Scope In: 12:26:03 PM Scope Out: 12:37:46 PM Scope Withdrawal Time: 0 hours 8 minutes 46 seconds  Total Procedure Duration: 0 hours 11 minutes 43 seconds  Findings:                 The perianal and digital rectal examinations were                            normal. Pertinent negatives include normal                            sphincter tone and no palpable rectal lesions.                           A 4 mm polyp was found in the descending colon. The  polyp was sessile. The polyp was removed with a                            cold biopsy forceps. Resection and retrieval were                            complete.                           Internal hemorrhoids were found during retroflexion. Complications:            No immediate complications. Estimated blood loss:                            Minimal. Estimated Blood Loss:     Estimated blood loss was minimal. Impression:               - One 4 mm polyp in the descending colon, removed                            with a cold biopsy forceps. Resected and retrieved.                           - Internal hemorrhoids. This is the likely source                            of bright red blood per rectum. Recommendation:           - Discharge patient to home (ambulatory).                           - Await pathology results.                           - Use  Anusol  suppository 25 mg per rectum twice                            daily x 2-4 weeks when experiencing rectal bleeding                            from hemorrhoids. Patient reports that she did not                            receive her prescription. Chart indicates that                            prescription was sent to [facility] pharmacy on                            01/20/2024 -we will request that staff send in                            another prescription.                           - The  findings and recommendations were discussed                            with the patient's family.                           - Patient has a contact number available for                            emergencies. The signs and symptoms of potential                            delayed complications were discussed with the                            patient. Return to normal activities tomorrow.                            Written discharge instructions were provided to the                            patient. Inocente Hausen, MD 01/30/2024 12:43:28 PM This report has been signed electronically.

## 2024-01-30 NOTE — Progress Notes (Signed)
 Pt's states no medical or surgical changes since previsit or office visit.

## 2024-01-30 NOTE — Patient Instructions (Signed)
 YOU HAD AN ENDOSCOPIC PROCEDURE TODAY AT THE [facility]:   Refer to the procedure report that was given to you for any specific questions about what was found during the examination.  If the procedure report does not answer your questions, please call your gastroenterologist to clarify.  If you requested that your care partner not be given the details of your procedure findings, then the procedure report has been included in a sealed envelope for you to review at your convenience later.  YOU SHOULD EXPECT: Some feelings of bloating in the abdomen. Passage of more gas than usual.  Walking can help get rid of the air that was put into your GI tract during the procedure and reduce the bloating. If you had a lower endoscopy (such as a colonoscopy or flexible sigmoidoscopy) you may notice spotting of blood in your stool or on the toilet paper. If you underwent a bowel prep for your procedure, you may not have a normal bowel movement for a few days.  Please Note:  You might notice some irritation and congestion in your nose or some drainage.  This is from the oxygen used during your procedure.  There is no need for concern and it should clear up in a day or so.  SYMPTOMS TO REPORT IMMEDIATELY:  Following lower endoscopy (colonoscopy or flexible sigmoidoscopy):  Excessive amounts of blood in the stool  Significant tenderness or worsening of abdominal pains  Swelling of the abdomen that is new, acute  Fever of 100F or higher  Await pathology results Use Anusol  suppository 25 mg per rectum twice daily for 2-4 weeks when experiencing rectal bleeding from hemorrhoids.   For urgent or emergent issues, a gastroenterologist can be reached at any hour by calling [phone]. Do not use MyChart messaging for urgent concerns.    DIET:  We do recommend a small meal at first, but then you may proceed to your regular diet.  Drink plenty of fluids but you should avoid alcoholic beverages for 24  hours.  ACTIVITY:  You should plan to take it easy for the rest of today and you should NOT DRIVE or use heavy machinery until tomorrow (because of the sedation medicines used during the test).    FOLLOW UP: Our staff will call the number listed on your records the next business day following your procedure.  We will call around 7:15- 8:00 am to check on you and address any questions or concerns that you may have regarding the information given to you following your procedure. If we do not reach you, we will leave a message.     If any biopsies were taken you will be contacted by phone or by letter within the next 1-3 weeks.  Please call us  at [phone] if you have not heard about the biopsies in 3 weeks.    SIGNATURES/CONFIDENTIALITY: You and/or your care partner have signed paperwork which will be entered into your electronic medical record.  These signatures attest to the fact that that the information above on your After Visit Summary has been reviewed and is understood.  Full responsibility of the confidentiality of this discharge information lies with you and/or your care-partner.

## 2024-01-31 ENCOUNTER — Other Ambulatory Visit (HOSPITAL_COMMUNITY): Payer: Self-pay

## 2024-01-31 ENCOUNTER — Telehealth: Payer: Self-pay | Admitting: *Deleted

## 2024-01-31 NOTE — Telephone Encounter (Signed)
  Follow up Call-     01/30/2024   11:09 AM  Call back number  Post procedure Call Back phone  # [phone]  Permission to leave phone message Yes     Patient questions:  Do you have a fever, pain , or abdominal swelling? No. Pain Score  0 *  Have you tolerated food without any problems? Yes.    Have you been able to return to your normal activities? Yes.    Do you have any questions about your discharge instructions: Diet   No. Medications  No. Follow up visit  No.  Do you have questions or concerns about your Care? No.  Actions: * If pain score is 4 or above: No action needed, pain <4.

## 2024-02-06 ENCOUNTER — Ambulatory Visit: Payer: Self-pay | Admitting: Pediatrics

## 2024-02-06 LAB — SURGICAL PATHOLOGY

## 2024-02-07 LAB — GENECONNECT MOLECULAR SCREEN: Genetic Analysis Overall Interpretation: NEGATIVE

## 2024-02-12 ENCOUNTER — Telehealth: Payer: Self-pay | Admitting: Family Medicine

## 2024-02-12 ENCOUNTER — Encounter: Admitting: Family Medicine

## 2024-02-12 NOTE — Telephone Encounter (Signed)
 Called patient to discuss the notes she placed for today's visit and that she possibly would have to make an appt with [facility].

## 2024-04-15 ENCOUNTER — Telehealth: Admitting: Physician Assistant

## 2024-04-15 DIAGNOSIS — L700 Acne vulgaris: Secondary | ICD-10-CM

## 2024-04-15 MED ORDER — CLINDAMYCIN PHOS (TWICE-DAILY) 1 % EX GEL: Freq: Two times a day (BID) | CUTANEOUS | 0 refills | Status: AC

## 2024-04-15 NOTE — Progress Notes (Signed)
 We are sorry that you are experiencing this issue.  Here is how we plan to help!  Based on what you shared with me it looks like you have acne.  Acne is a disorder of the hair follicles and oil glands (sebaceous glands). The sebaceous glands secrete oils to keep the skin moist.  When the glands get clogged, it can lead to pimples or cysts.  These cysts may become infected and leave scars. Acne is very common and normally occurs at puberty.  Acne is also inherited.  Your personal care plan consists of the following recommendations:  I recommend that you use a daily cleanser  You might try 2% topical salicylic acid pads or wipes.  Use the pads to daily cleanse your skin.  I have prescribed a topical gel with an antibiotic:  Clindamycin  1% lotion.  Apply the lotion to the affected skin twice daily.  Be sure to read the package insert to understand potential side effects,   If excessive dryness or peeling occurs, reduce dose frequency or concentration of the topical scrubs.  If excessive stinging or burning occurs, remove the topical gel with mild soap and water and resume at a lower dose the next day.  Remember oral antibiotics and topical acne treatments may increase your sensitivity to the sun!  HOME CARE: Do not squeeze pimples because that can often lead to infections, worse acne, and scars. Use a moisturizer that contains retinoid or fruit acids that may inhibit the development of new acne lesions. Although there is not a clear link that foods can cause acne, doctors do believe that too many sweets predispose you to skin problems.  GET HELP RIGHT AWAY IF: If your acne gets worse or is not better within 10 days. If you become depressed. If you become pregnant, discontinue medications and call your OB/GYN.  MAKE SURE YOU: Understand these instructions. Will watch your condition. Will get help right away if you are not doing well or get worse.   Your e-visit answers were reviewed by a  board certified advanced clinical practitioner to complete your personal care plan.  Depending upon the condition, your plan could have included both over the counter or prescription medications.  Please review your pharmacy choice.  If there is a problem, you may contact your provider through [employer] and have the prescription routed to another pharmacy.  Your safety is important to us .  If you have drug allergies check your prescription carefully.  For the next 24 hours you can use MyChart to ask questions about today's visit, request a non-urgent call back, or ask for a work or school excuse from your e-visit provider.  You will get an email in the next two days asking about your experience. I hope that your e-visit has been valuable and will speed your recovery.  I have spent 5 minutes in review of e-visit questionnaire, review and updating patient chart, medical decision making and response to patient.   Elsie Velma Lunger, PA-C
